# Patient Record
Sex: Male | Born: 1975
Health system: Southern US, Community
[De-identification: ages and names within clinical notes are randomized; demographics above are authoritative.]

## PROBLEM LIST (undated history)

## (undated) DIAGNOSIS — Q315 Congenital laryngomalacia: Secondary | ICD-10-CM

## (undated) DIAGNOSIS — D150 Benign neoplasm of thymus: Secondary | ICD-10-CM

## (undated) DIAGNOSIS — D2310 Other benign neoplasm of skin of unspecified eyelid, including canthus: Secondary | ICD-10-CM

## (undated) DIAGNOSIS — J40 Bronchitis, not specified as acute or chronic: Secondary | ICD-10-CM

## (undated) DIAGNOSIS — R0602 Shortness of breath: Secondary | ICD-10-CM

## (undated) HISTORY — PX: MINOR HEMORRHOIDECTOMY: SHX6238

## (undated) HISTORY — DX: Other benign neoplasm of skin of unspecified eyelid, including canthus: D23.10

## (undated) HISTORY — PX: OTHER SURGICAL HISTORY: SHX169

## (undated) HISTORY — DX: Congenital laryngomalacia: Q31.5

---

## 2012-02-21 ENCOUNTER — Ambulatory Visit (INDEPENDENT_AMBULATORY_CARE_PROVIDER_SITE_OTHER): Payer: PRIVATE HEALTH INSURANCE | Admitting: Family Medicine

## 2012-02-21 VITALS — BP 107/71 | HR 61

## 2012-02-21 DIAGNOSIS — M67919 Unspecified disorder of synovium and tendon, unspecified shoulder: Secondary | ICD-10-CM

## 2012-02-22 ENCOUNTER — Encounter: Payer: Self-pay | Admitting: Family Medicine

## 2012-02-22 DIAGNOSIS — M67919 Unspecified disorder of synovium and tendon, unspecified shoulder: Secondary | ICD-10-CM | POA: Insufficient documentation

## 2012-02-22 NOTE — Progress Notes (Signed)
  Subjective:    Patient ID: [name], male    DOB: 06/26/1976, 36 y.o.   MRN: [id number]  HPI Shoulder pain for several months. Bothers him most when he's trying to play tennis, doing certain exercises such as dumbbell progress, benchpress. Feels like a sharp pain in the anterior part of the shoulder with some resulting aching. It occasionally keeps him awake at night. Recalls  specific injury recently when he was performing jujitsu and was thrown down landing with axial load placed on his outstretched straight left arm. At that time he had some mild pain but was able to continue performance. Denies any numbness or tingling in his hand. He said no weakness in the arm. He is left-hand dominant.  PERTINENT  PMH / PSH: No prior history of left arm or shoulder injury. Negative for diabetes mellitus.   Review of Systems    denies unusual weight change, fever. Please see history present illness above for pertinent review of systems. Objective:   Physical Exam  Vital signs reviewed. GENERAL: Well developed, well nourished, no acute distress SHOULDER: Bilaterally symmetrical. Shoulder shrug is symmetrical and normal strength. Normal scapular motion. Mild to moderate pain with supraspinatus testing against resistance. He has full range of motion and intact strength in all planes of the rotator cuff. He also has some mild pain with resisted external rotation. Axial loading against the anterior shoulder capsule reproduces his pain. The biceps tendon is nontender.  ULTRASOUND:  Biceps tendon is well seated and there is no edema around it. There is some calcified area in the articular surface of the subscapularis muscle. There is no impingement. There are also some mild calcifications in one area of apparent scar but no muscular defect in the supraspinatus muscle.      Assessment & Plan:

## 2013-06-30 DIAGNOSIS — J40 Bronchitis, not specified as acute or chronic: Secondary | ICD-10-CM

## 2013-06-30 HISTORY — DX: Bronchitis, not specified as acute or chronic: J40

## 2013-07-02 ENCOUNTER — Emergency Department (HOSPITAL_COMMUNITY): Payer: PRIVATE HEALTH INSURANCE

## 2013-07-02 ENCOUNTER — Emergency Department (HOSPITAL_COMMUNITY)
Admission: EM | Admit: 2013-07-02 | Discharge: 2013-07-02 | Disposition: A | Discharge status: Home / Self Care | Payer: PRIVATE HEALTH INSURANCE | Attending: Emergency Medicine | Admitting: Emergency Medicine

## 2013-07-02 ENCOUNTER — Encounter (HOSPITAL_COMMUNITY): Payer: Self-pay | Admitting: Emergency Medicine

## 2013-07-02 DIAGNOSIS — IMO0001 Reserved for inherently not codable concepts without codable children: Secondary | ICD-10-CM | POA: Insufficient documentation

## 2013-07-02 DIAGNOSIS — R222 Localized swelling, mass and lump, trunk: Secondary | ICD-10-CM | POA: Insufficient documentation

## 2013-07-02 DIAGNOSIS — R509 Fever, unspecified: Secondary | ICD-10-CM | POA: Insufficient documentation

## 2013-07-02 DIAGNOSIS — Z87891 Personal history of nicotine dependence: Secondary | ICD-10-CM | POA: Insufficient documentation

## 2013-07-02 DIAGNOSIS — M542 Cervicalgia: Secondary | ICD-10-CM | POA: Insufficient documentation

## 2013-07-02 DIAGNOSIS — J9859 Other diseases of mediastinum, not elsewhere classified: Secondary | ICD-10-CM

## 2013-07-02 LAB — HCG, QUANTITATIVE, PREGNANCY: hCG, Beta Chain, Quant, S: <1 m[IU]/mL (ref <5)

## 2013-07-02 LAB — CBC WITH DIFFERENTIAL/PLATELET
Basophils Absolute: 0 10*3/uL (ref 0.0–0.1)
Basophils Relative: 0 % (ref 0–1)
Eosinophils Absolute: 0 10*3/uL (ref 0.0–0.7)
HCT: 40.1 % (ref 39.0–52.0)
Hemoglobin: 13.2 g/dL (ref 13.0–17.0)
Lymphocytes Relative: 33 % (ref 12–46)
Lymphs Abs: 3.2 10*3/uL (ref 0.7–4.0)
MCH: 26.1 pg (ref 26.0–34.0)
MCHC: 32.9 g/dL (ref 30.0–36.0)
Monocytes Absolute: 0.9 10*3/uL (ref 0.1–1.0)
Monocytes Relative: 9 % (ref 3–12)
Neutrophils Relative %: 58 % (ref 43–77)
RBC: 5.05 MIL/uL (ref 4.22–5.81)
RDW: 13.2 % (ref 11.5–15.5)

## 2013-07-02 LAB — COMPREHENSIVE METABOLIC PANEL
ALT: 36 U/L (ref 0–53)
Albumin: 4.3 g/dL (ref 3.5–5.2)
Alkaline Phosphatase: 77 U/L (ref 39–117)
BUN: 13 mg/dL (ref 6–23)
Chloride: 99 mEq/L (ref 96–112)
Creatinine, Ser: 0.82 mg/dL (ref 0.50–1.35)
GFR calc Af Amer: >90 mL/min (ref >90)
Glucose, Bld: 92 mg/dL (ref 70–99)
Potassium: 4.1 mEq/L (ref 3.5–5.1)
Sodium: 135 mEq/L (ref 135–145)
Total Bilirubin: 0.9 mg/dL (ref 0.3–1.2)
Total Protein: 7.7 g/dL (ref 6.0–8.3)

## 2013-07-02 MED ORDER — IOHEXOL 350 MG/ML SOLN
100.0000 mL | Freq: Once | INTRAVENOUS | Status: AC | PRN
  Administered 2013-07-02: 80 mL via INTRAVENOUS

## 2013-07-02 MED ORDER — SODIUM CHLORIDE 0.9 % IV SOLN
INTRAVENOUS | Status: DC
Start: 2013-07-02 — End: 2013-07-02

## 2013-07-02 NOTE — ED Provider Notes (Signed)
CSN: [id number]     Arrival date & time 07/02/13  1521 History   First MD Initiated Contact with Patient 07/02/13 1542     Chief Complaint  Patient presents with  . Neck Pain  . Pleurisy   (Consider location/radiation/quality/duration/timing/severity/associated sxs/prior Treatment) Patient is a 37 y.o. male presenting with neck pain. The history is provided by the patient.  Neck Pain  Pt complains of pleuritic chest pain, myalgias, and low grade fever x 2 days--using motrin with temporary relief--had some bilateral ant cervical pain that's since resolved, no headache or photophobia or rashes--temp at home 99.5--denies anginal type chest pain--pain described as sharp and worse with lying on his side-- no leg pain or swelling, but recent travel by car over 2 hours each way--currently, no ear pain or sore throat History reviewed. No pertinent past medical history. Past Surgical History  Procedure Laterality Date  . Tracheotomy      No family history on file. History  Substance Use Topics  . Smoking status: Former Games developer  . Smokeless tobacco: Not on file  . Alcohol Use: Yes     Comment: socially     Review of Systems  Musculoskeletal: Positive for neck pain.  All other systems reviewed and are negative.    Allergies  Review of patient's allergies indicates no known allergies.  Home Medications  No current outpatient prescriptions on file. BP 106/62  Pulse 52  Temp(Src) 97.5 F (36.4 C) (Oral)  Resp 16  SpO2 100% Physical Exam  Nursing note and vitals reviewed. Constitutional: He is oriented to person, place, and time. He appears well-developed and well-nourished.  Non-toxic appearance. No distress.  HENT:  Head: Normocephalic and atraumatic.  Eyes: Conjunctivae, EOM and lids are normal. Pupils are equal, round, and reactive to light.  Neck: Normal range of motion. Neck supple. No tracheal deviation present. No mass present.  Cardiovascular: Normal rate, regular rhythm  and normal heart sounds.  Exam reveals no gallop.   No murmur heard. Pulmonary/Chest: Effort normal and breath sounds normal. No stridor. No respiratory distress. He has no decreased breath sounds. He has no wheezes. He has no rhonchi. He has no rales.  Abdominal: Soft. Normal appearance and bowel sounds are normal. He exhibits no distension. There is no tenderness. There is no rebound and no CVA tenderness.  Musculoskeletal: Normal range of motion. He exhibits no edema and no tenderness.  Neurological: He is alert and oriented to person, place, and time. He has normal strength. No cranial nerve deficit or sensory deficit. GCS eye subscore is 4. GCS verbal subscore is 5. GCS motor subscore is 6.  Skin: Skin is warm and dry. No abrasion and no rash noted.  Psychiatric: He has a normal mood and affect. His speech is normal and behavior is normal.    ED Course  Procedures (including critical care time) Labs Review Labs Reviewed  D-DIMER, QUANTITATIVE   Imaging Review No results found.  EKG Interpretation     Ventricular Rate:  56 PR Interval:  177 QRS Duration: 85 QT Interval:  425 QTC Calculation: 410 R Axis:   85 Text Interpretation:  Sinus rhythm Probable left atrial enlargement RSR' in V1 or V2, probably normal variant            MDM  No diagnosis found.   Pt given results of ct scan and I spoke with dr. Darnelle Catalan who will see the patient in the morning-requested blood work drawn  Toy Baker, MD 07/02/13 1826

## 2013-07-02 NOTE — Progress Notes (Signed)
Patient confirms his pcp is Dr. [name].  System updated.

## 2013-07-02 NOTE — ED Notes (Signed)
Pt presents with c/o chest discomfort when he takes a deep breath and some neck pain that started on Saturday. Pt has had the flu shot but does believe that this may be the flu. Pt says he has had a small cough with sputum but no shortness of breath.

## 2013-07-03 ENCOUNTER — Other Ambulatory Visit: Payer: Self-pay | Admitting: *Deleted

## 2013-07-03 ENCOUNTER — Encounter: Payer: Self-pay | Admitting: Oncology

## 2013-07-03 ENCOUNTER — Institutional Professional Consult (permissible substitution) (INDEPENDENT_AMBULATORY_CARE_PROVIDER_SITE_OTHER): Payer: PRIVATE HEALTH INSURANCE | Admitting: Cardiothoracic Surgery

## 2013-07-03 ENCOUNTER — Other Ambulatory Visit: Payer: Self-pay | Admitting: Oncology

## 2013-07-03 ENCOUNTER — Ambulatory Visit (HOSPITAL_BASED_OUTPATIENT_CLINIC_OR_DEPARTMENT_OTHER): Payer: PRIVATE HEALTH INSURANCE | Admitting: Lab

## 2013-07-03 ENCOUNTER — Encounter: Payer: Self-pay | Admitting: Cardiothoracic Surgery

## 2013-07-03 ENCOUNTER — Ambulatory Visit (HOSPITAL_BASED_OUTPATIENT_CLINIC_OR_DEPARTMENT_OTHER): Payer: PRIVATE HEALTH INSURANCE | Admitting: Oncology

## 2013-07-03 ENCOUNTER — Ambulatory Visit (HOSPITAL_COMMUNITY)
Admission: RE | Admit: 2013-07-03 | Discharge: 2013-07-03 | Disposition: A | Discharge status: Home / Self Care | Payer: PRIVATE HEALTH INSURANCE | Source: Ambulatory Visit | Attending: Oncology | Admitting: Oncology

## 2013-07-03 VITALS — BP 110/71 | HR 55 | Temp 98.8°F | Resp 20 | Wt 159.2 lb

## 2013-07-03 VITALS — BP 116/69 | HR 66 | Resp 16 | Ht 66.0 in | Wt 160.0 lb

## 2013-07-03 DIAGNOSIS — R0602 Shortness of breath: Secondary | ICD-10-CM

## 2013-07-03 DIAGNOSIS — R222 Localized swelling, mass and lump, trunk: Secondary | ICD-10-CM | POA: Insufficient documentation

## 2013-07-03 DIAGNOSIS — J9859 Other diseases of mediastinum, not elsewhere classified: Secondary | ICD-10-CM

## 2013-07-03 LAB — AFP TUMOR MARKER: AFP-Tumor Marker: <1.3 ng/mL (ref 0.0–8.0)

## 2013-07-03 LAB — LACTATE DEHYDROGENASE (CC13): LDH: 265 U/L — ABNORMAL HIGH (ref 125–245)

## 2013-07-03 MED ORDER — IOHEXOL 300 MG/ML  SOLN
100.0000 mL | Freq: Once | INTRAMUSCULAR | Status: AC | PRN
  Administered 2013-07-03: 100 mL via INTRAVENOUS

## 2013-07-03 NOTE — Progress Notes (Signed)
Checked in new patient with no financial issues. He has not been to [place]. Tiffany escorted him to Dr. Darnelle Catalan.

## 2013-07-03 NOTE — Progress Notes (Signed)
[address]       [place] [address]             [phone]                    [facility] Medical Record #[id number] Date of Birth: 11-11-1975  Referring: Magrinat, Valentino Hue, MD Primary Care: [name], MD  Chief Complaint:    . 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 He came for medical attention when he noted increasing shortness of breath when climbing stairs over the past several days. Chest x-ray was normal d-dimer was mildly elevated. For this reason a CT scan of the chest was done to rule out pulmonary emboli. 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     Current Activity/ Functional Status:  Patient is independent with mobility/ambulation, transfers, ADL's, IADL's.  Zubrod Score: At the time of surgery this patient's most appropriate activity status/level should be described as: []  Normal activity, no symptoms [x]  Symptoms, fully ambulatory []  Symptoms, in bed less than or equal to 50% of the time []  Symptoms, in bed greater than 50% of the time but less than 100% []  Bedridden []  Moribund   Past Medical History  Diagnosis Date  . Laryngomalacia, congenital     resolved  . Dermoid cyst of eyelid     removed from left upper canthus area    Past Surgical History  Procedure Laterality Date  . Tracheotomy     . Minor hemorrhoidectomy        History   Social History  . Marital Status: Married    Spouse Name: Ernest Haber    Number of Children: 29 son  38 years old  . Years of Education: MD   Occupational History  . 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 Smoking status: Former  Smoker -- 0.25 packs/day for 3 years    Types: Cigarettes    Quit date: 07/03/2013  . Smokeless tobacco: Never Used  . Alcohol Use: Yes     Comment: socially   . Drug Use: No      History  Smoking status  . Former Smoker -- 0.25 packs/day for 3 years  . Types: Cigarettes  . Quit date: 07/03/2013  Smokeless tobacco  . Never Used    History  Alcohol Use  . Yes    Comment: socially      No Known Allergies  No current outpatient prescriptions on file.   No current facility-administered medications for this visit.    Family History: Patient's mother is alive has had a history of hip replacement and depression she is a physician in [place], father had CAD in 65 at age 55, paternal grandfather had history of prostate cancer maternal grandmother renal failure and diabetes paternal grandmother died of abdominal mass. Patient has one brother who is healthy with the exception of smoking. One 64-year-old child who is healthy   Review of Systems:     Cardiac Review of Systems: Y or N  Chest Pain [  y  ]  Resting SOB [  n ]  Exertional SOB  [ y ]  Myer Peer  ]   Pedal Edema [ n  ]    Palpitations [ n] Syncope  [ n ]   Presyncope [n  ]  General Review of Systems: [Y] = yes [  ]=no Constitional: recent weight change [ n ]; anorexia [n  ]; fatigue Cove.Etienne  ]; nausea [  n]; night sweats [ n ]; fever [ n ]; or chills [ y ];                                                                                                                                          Dental: poor dentition[n  ]; Last Dentist visit:   Eye : blurred vision [n  ]; diplopia [ n ]; vision changes [ n];  Amaurosis fugax[  n]; Resp: cough [  ];  wheezing[ n ];  hemoptysis[n  ]; shortness of breath[y  ]; paroxysmal nocturnal dyspnea[  ]; dyspnea on exertion[ y ]; or orthopnea[  ];  GI:  gallstones[  ], vomiting[  ];  dysphagia[  ]; melena[  ];  hematochezia [  ]; heartburn[  ];   Hx of  Colonoscopy[  ]; GU: kidney stones  [  ]; hematuria[  ];   dysuria [  ];  nocturia[  ];  history of     obstruction [  ]; urinary frequency [  ]             Skin: rash, swelling[  ];, hair loss[  ];  peripheral edema[  ];  or itching[  ]; Musculosketetal: myalgias[  ];  joint swelling[  ];  joint erythema[  ];  joint pain[  ];  back pain[  ];  Heme/Lymph: bruising[  ];  bleeding[  ];  anemia[  ];  Neuro: TIA[  ];  headaches[  ];  stroke[  ];  vertigo[  ];  seizures[  ];   paresthesias[  ];  difficulty walking[n  ];  Psych:depression[  ]; anxiety[  ];  Endocrine: diabetes[  ];  thyroid dysfunction[  ];  Immunizations: Flu Cove.Etienne  ]; Pneumococcal[n  ];  Other:  Physical Exam: BP 116/69  Pulse 66  Resp 16  Ht 5\' 6"  (1.676 m)  Wt 160 lb (72.576 kg)  BMI 25.84 kg/m2  SpO2 99%  General appearance: alert, cooperative and appears stated age Neurologic: intact Heart: regular rate and rhythm, S1, S2 normal, no murmur, click, rub or gallop Lungs: clear to auscultation bilaterally and normal percussion bilaterally Abdomen: soft, non-tender; bowel sounds normal; no masses,  no organomegaly Extremities: extremities normal, atraumatic, no cyanosis or edema and Homans sign is negative, no sign of DVT Normal male genitalia without evidence of testicular mass no inguinal adenopathy or hernias Scarring in the suprasternal notch from previous tracheal surgery No cervical or supraclavicular adenopathy  Diagnostic Studies & Laboratory data:  Recent Radiology Findings:   Dg Chest 2 View  07/02/2013   CLINICAL DATA:  Chest pain with respiration. Some neck pain.  EXAM: CHEST  2 VIEW  COMPARISON:  None.  FINDINGS: Lungs are adequately inflated without consolidation or effusion. Cardiomediastinal silhouette is within normal. There is minimal spondylosis of the spine.  IMPRESSION: No acute cardiopulmonary disease.   Electronically Signed   By: Elberta Fortis M.D.   On: 07/02/2013 16:30   Ct Angio Chest Pe W/cm &/or Wo Cm  07/02/2013   CLINICAL  DATA:  Initial encounter for chest pain and shortness of breath. Elevated D-dimer.  EXAM: CT ANGIOGRAPHY CHEST WITH CONTRAST  TECHNIQUE: Multidetector CT imaging of the chest was performed using the standard protocol during bolus administration of intravenous contrast. Multiplanar CT image reconstructions including MIPs were obtained to evaluate the vascular anatomy.  CONTRAST:  80mL OMNIPAQUE IOHEXOL 350 MG/ML IV.  COMPARISON:  None.  FINDINGS: Contrast opacification of the pulmonary arteries is very good. No filling defects within either main pulmonary artery or their branches in either lung to suggest pulmonary embolism. Heart size normal. No pericardial effusion. No visible coronary atherosclerosis. Mild calcified plaque in the proximal descending thoracic aorta, without evidence of atherosclerosis elsewhere.  Mass in the anterior superior mediastinum measuring approximately 3.7 x 5.9 x 4.8 cm (series 5, image 37 and series 9, image 49). No mass or lymphadenopathy elsewhere in the mediastinum, either hilum, or either axilla. Visualized thyroid gland unremarkable.  Pulmonary parenchyma clear without evidence of localized airspace consolidation, interstitial disease, or parenchymal nodules or masses. No pleural effusions. Central airways patent with mild bronchial wall thickening.  Moderate bilateral gynecomastia. Possible hepatosplenomegaly, though these organs are incompletely imaged. Calcified granuloma in the anterior segment right lobe of liver. Visualized upper abdomen otherwise unremarkable. Bone window images demonstrate moderate thoracic spondylosis.  Review of the MIP images confirms the above findings.  IMPRESSION: 1. No evidence of pulmonary embolism. 2. Anterior superior mediastinal mass, measured above, without evidence of mass or lymphadenopathy elsewhere. Differential diagnosis would include thymoma, lymphoma, and teratoma. 3. Mild central bronchial wall thickening is consistent with asthma and/or  bronchitis. No acute cardiopulmonary disease otherwise. 4. Moderate bilateral gynecomastia. 5. Possible hepatosplenomegaly, though these organs were not completely imaged on the chest CT. 6. Minimal calcified plaque in the proximal descending thoracic aorta, indicating advanced atherosclerosis for age.   Electronically Signed   By: Hulan Saas M.D.   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  EXAM: CT ABDOMEN AND PELVIS WITH CONTRAST  TECHNIQUE: Multidetector CT imaging of the abdomen and pelvis was performed using the standard protocol following bolus administration of intravenous contrast.  CONTRAST:  OMNIPAQUE IOHEXOL 300 MG/ML  SOLN  COMPARISON:  Chest CT 07/02/2013.  FINDINGS: The solid abdominal organs are normal. The No mass lesions or evidence of metastatic disease. The gallbladder is normal. No common bowel duct dilatation.  The stomach, duodenum, small bowel and colon are unremarkable. No inflammatory changes, mass lesions or obstructive findings. The appendix is normal. No mesenteric or retroperitoneal mass or adenopathy. The aorta and branch vessels are normal. The portal, hepatic, splenic and renal veins are patent.  The bladder, prostate gland and seminal vesicles are unremarkable. No pelvic mass, adenopathy or free pelvic fluid collections. No inguinal mass or adenopathy.  The bony structures are unremarkable. Bilateral pars defects are noted at L5 without spondylolisthesis. No bone  lesions. There are degenerative changes noted in the lower thoracic spine.  IMPRESSION: Unremarkable CT abdomen/ pelvis. No findings for metastatic disease.   Electronically Signed   By: Loralie Champagne M.D.   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  I reviewed the radiographic findings in detail with the patient and his wife. The anterior mediastinal mass appears separate from the thyroid, is large but is not obviously invading surrounding structures by CT. I recommended to the patient that we proceed with sternotomy or partial sternotomy and midline resection of the mass both for treatment and diagnostic reasons. The procedures described in detail and the patient is willing to proceed. Like to talk to his family but would be willing to proceed with surgery on Friday, November 7.    [name] MD      [address] [place] [place] [address] Office [phone]   Beeper [phone]  07/03/2013 3:16 PM

## 2013-07-03 NOTE — Progress Notes (Signed)
ID: [name] OB: June 08, 1976  MR#: [id number]  WUJ#:[id number]  PCP: [name], MD GYN:   SU:  OTHER MD:  CHIEF COMPLAINT: "I'm having trouble breathing."  HISTORY OF PRESENT ILLNESS: [name] started feeling a little tired and short of breath about 3 days ago (end of October 20 14th). On November 1 she had myalgias and arthralgias and a temperature up to 99.0. He was feeling like a neck pain bilaterally, TMJ-like symptoms, but the main problem was that he couldn't take a deep breath. There was no pleurisy. There was no cough, phlegm production, or hemoptysis.   As the symptoms did not resolve he presented to the emergency room where a d-dimer was obtained. As this was elevated, a CT angiogram followed. 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 This was clearly separate from the thyroid gland. There was no obvious adenopathy.   With this information we were contacted. We suggested the obtaining of additional lab work, to include an alpha-fetoprotein and beta hCG. [name]'s subsequent history is as detailed below.  INTERVAL HISTORY: [name] was evaluated at the [facility] 07/03/2013 accompanied by his wife [name].  REVIEW OF SYSTEMS: Aside from the mild temperature noted 2 days ago, J. has had no fever, drenching sweats, unexplained severe fatigue (he has continued to work full-time), or unexplained weight loss. He is not aware of any adenopathy. He has a history of eyelids drooping, left greater than right, which has been present many years, and is not more prominent now than before. He has a history of gynecomastia going back to his early teens. Otherwise, though he is very active physically (goes to the gym regularly ) and has not missed any work, to chief symptom is a sensation that he cannot take a deep breath. Carrying his son on his shoulders a few steps upstairs I made him more short of breath than usual. A detailed review of  systems was otherwise entirely non-contributory.  PAST MEDICAL HISTORY: Past Medical History  Diagnosis Date  . Laryngomalacia, congenital     resolved  . Dermoid cyst of eyelid     removed from left upper canthus area    PAST SURGICAL HISTORY: Past Surgical History  Procedure Laterality Date  . Tracheotomy     . Minor hemorrhoidectomy      FAMILY HISTORY No family history on file. The patient's parents are living, in their mid 18s. The patient has one brother, no sisters. There is no history of cancer in the immediate family. The patient's father's father was diagnosed with prostate cancer in his 32s. The patient's father's mother was diagnosed with some intra-abdominal malignancy.  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 His wife [name] is a Adult nurse at [employer]. They have one son, [name], currently 18 years old. They tell me they do not intend to have any more children. The patient's cultur/re yearal sometime ligious background is Hindu.   ADVANCED DIRECTIVES: In place   HEALTH MAINTENANCE: History  Substance Use Topics  . Smoking status: Former Games developer  . Smokeless tobacco: Not on file  . Alcohol Use: Yes     Comment: socially      Colonoscopy:  PSA: n/a:  Bone density:  Lipid panel:  No Known Allergies  No current outpatient prescriptions on file.   No current facility-administered medications for this visit.    OBJECTIVE: There were no vitals filed for this visit.   There  is no height or weight on file to calculate BMI.    ECOG FS:1 - Symptomatic but completely ambulatory  Ocular: Sclerae unicteric, pupils equal, round; no obvious ptosis Lymphatic: No cervical or supraclavicular adenopathy; minimal right axillary adenopathy Lungs no rales or rhonchi, good excursion bilaterally Heart regular rate and rhythm, no murmur appreciated Abd soft, nontender, positive bowel sounds, no splenomegaly  MSK no focal spinal tenderness, no joint  edema Neuro: non-focal, well-oriented, appropriate affect Breasts:  minimal gynecomastia, no masses palpated    LAB RESULTS: Results for GARDINER, ESPANA (MRN [id number]) as of 07/03/2013 08:54  Ref. Range 07/02/2013 18:25  AFP-Tumor Marker Latest Range: 0.0-8.0 ng/mL <1.3  Results for REGINALD, WEIDA [facility] (MRN [id number]) as of 07/03/2013 08:54  Ref. Range 07/02/2013 18:25  hCG, Beta Chain, Quant, S Latest Range: <5 mIU/mL <1    CMP     Component Value Date/Time   NA 135 07/02/2013 1825   K 4.1 07/02/2013 1825   CL 99 07/02/2013 1825   CO2 25 07/02/2013 1825   GLUCOSE 92 07/02/2013 1825   BUN 13 07/02/2013 1825   CREATININE 0.82 07/02/2013 1825   CALCIUM 9.8 07/02/2013 1825   PROT 7.7 07/02/2013 1825   ALBUMIN 4.3 07/02/2013 1825   AST 54* 07/02/2013 1825   ALT 36 07/02/2013 1825   ALKPHOS 77 07/02/2013 1825   BILITOT 0.9 07/02/2013 1825   GFRNONAA >90 07/02/2013 1825   GFRAA >90 07/02/2013 1825    I No results found for this basename: SPEP,  UPEP,   kappa and lambda light chains    Lab Results  Component Value Date   WBC 9.8 07/02/2013   NEUTROABS 5.6 07/02/2013   HGB 13.2 07/02/2013   HCT 40.1 07/02/2013   MCV 79.4 07/02/2013   PLT 250 07/02/2013      Chemistry      Component Value Date/Time   NA 135 07/02/2013 1825   K 4.1 07/02/2013 1825   CL 99 07/02/2013 1825   CO2 25 07/02/2013 1825   BUN 13 07/02/2013 1825   CREATININE 0.82 07/02/2013 1825      Component Value Date/Time   CALCIUM 9.8 07/02/2013 1825   ALKPHOS 77 07/02/2013 1825   AST 54* 07/02/2013 1825   ALT 36 07/02/2013 1825   BILITOT 0.9 07/02/2013 1825       No results found for this basename: LABCA2    No components found with this basename: LABCA125    No results found for this basename: INR,  in the last 168 hours  Urinalysis No results found for this basename: colorurine,  appearanceur,  labspec,  phurine,  glucoseu,  hgbur,  bilirubinur,  ketonesur,  proteinur,  urobilinogen,  nitrite,  leukocytesur     STUDIES: Dg Chest 2 View  07/02/2013   CLINICAL DATA:  Chest pain with respiration. Some neck pain.  EXAM: CHEST  2 VIEW  COMPARISON:  None.  FINDINGS: Lungs are adequately inflated without consolidation or effusion. Cardiomediastinal silhouette is within normal. There is minimal spondylosis of the spine.  IMPRESSION: No acute cardiopulmonary disease.   Electronically Signed   By: Elberta Fortis M.D.   On: 07/02/2013 16:30   Ct Angio Chest Pe W/cm &/or Wo Cm  07/02/2013   CLINICAL DATA:  Initial encounter for chest pain and shortness of breath. Elevated D-dimer.  EXAM: CT ANGIOGRAPHY CHEST WITH CONTRAST  TECHNIQUE: Multidetector CT imaging of the chest was performed using the standard protocol during bolus administration of intravenous contrast. Multiplanar  CT image reconstructions including MIPs were obtained to evaluate the vascular anatomy.  CONTRAST:  80mL OMNIPAQUE IOHEXOL 350 MG/ML IV.  COMPARISON:  None.  FINDINGS: Contrast opacification of the pulmonary arteries is very good. No filling defects within either main pulmonary artery or their branches in either lung to suggest pulmonary embolism. Heart size normal. No pericardial effusion. No visible coronary atherosclerosis. Mild calcified plaque in the proximal descending thoracic aorta, without evidence of atherosclerosis elsewhere.  Mass in the anterior superior mediastinum measuring approximately 3.7 x 5.9 x 4.8 cm (series 5, image 37 and series 9, image 49). No mass or lymphadenopathy elsewhere in the mediastinum, either hilum, or either axilla. Visualized thyroid gland unremarkable.  Pulmonary parenchyma clear without evidence of localized airspace consolidation, interstitial disease, or parenchymal nodules or masses. No pleural effusions. Central airways patent with mild bronchial wall thickening.  Moderate bilateral gynecomastia. Possible hepatosplenomegaly, though these organs are incompletely imaged. Calcified granuloma in the anterior  segment right lobe of liver. Visualized upper abdomen otherwise unremarkable. Bone window images demonstrate moderate thoracic spondylosis.  Review of the MIP images confirms the above findings.  IMPRESSION: 1. No evidence of pulmonary embolism. 2. Anterior superior mediastinal mass, measured above, without evidence of mass or lymphadenopathy elsewhere. Differential diagnosis would include thymoma, lymphoma, and teratoma. 3. Mild central bronchial wall thickening is consistent with asthma and/or bronchitis. No acute cardiopulmonary disease otherwise. 4. Moderate bilateral gynecomastia. 5. Possible hepatosplenomegaly, though these organs were not completely imaged on the chest CT. 6. Minimal calcified plaque in the proximal descending thoracic aorta, indicating advanced atherosclerosis for age.   Electronically Signed   By: Hulan Saas M.D.   On: 07/02/2013 17:53    ASSESSMENT: 37 y.o.  [place] man presenting with shortness of breath, CT scan showing an anterior mediastinal mass, with normal beta hCG and alpha-fetoprotein  PLAN: We discussed Jager's situation in detail. He understands that in a young man an anterior mediastinal mass can represent thymoma, thymic carcinoma, a germ cell tumor including seminoma, nonseminomatous tumors, and teratoma, or lymphoma, which could be T. or B-cell. Tuberculosis would be much less common in the setting. The thyroid gland is seen clearly separate from this mass and I do not think it is in the differential.  Since the alpha-fetoprotein and beta hCG are normal, we are going to have to proceed to biopsy. We are going to obtain a CT scan of the retroperitoneum just to make sure there is nothing else that might be easier to biopsy, and if there is not we will try to obtain a core biopsy today. The patient is planning to go to [place] to visit family in the next few days. He will be back November 10. Possibly we would have the diagnosis at that time and could proceed to  discussion of prognosis and treatment.   [place] and [place] have a very good understanding of this plan. They understand also the goal of treatment is cure. I am making them a return appointment here for November 10 to discuss results of the biopsy we hope to obtain today.  Lowella Dell, MD   07/03/2013 8:49 AM  ADDENDUM: CT of abdomen/ pelvis is unremarkable. Have discussed situation with Dr gerhardt and Dr Mahala Menghini and the plan will be for surgery, likely 07/04/2013. I will meet with the patient as soon as we have the final pathology to discuss definitive plans.

## 2013-07-04 ENCOUNTER — Encounter (HOSPITAL_COMMUNITY): Payer: Self-pay | Admitting: Pharmacy Technician

## 2013-07-04 ENCOUNTER — Other Ambulatory Visit: Payer: Self-pay | Admitting: *Deleted

## 2013-07-04 DIAGNOSIS — J9859 Other diseases of mediastinum, not elsewhere classified: Secondary | ICD-10-CM

## 2013-07-05 ENCOUNTER — Encounter (HOSPITAL_COMMUNITY)
Admission: RE | Admit: 2013-07-05 | Discharge: 2013-07-05 | Disposition: A | Discharge status: Home / Self Care | Payer: PRIVATE HEALTH INSURANCE | Source: Ambulatory Visit | Attending: Cardiothoracic Surgery | Admitting: Cardiothoracic Surgery

## 2013-07-05 ENCOUNTER — Other Ambulatory Visit: Payer: Self-pay | Admitting: Oncology

## 2013-07-05 ENCOUNTER — Encounter (HOSPITAL_COMMUNITY): Payer: Self-pay

## 2013-07-05 VITALS — BP 107/67 | HR 60 | Temp 98.3°F | Resp 18 | Ht 66.0 in | Wt 159.6 lb

## 2013-07-05 DIAGNOSIS — J9859 Other diseases of mediastinum, not elsewhere classified: Secondary | ICD-10-CM

## 2013-07-05 HISTORY — DX: Shortness of breath: R06.02

## 2013-07-05 HISTORY — DX: Bronchitis, not specified as acute or chronic: J40

## 2013-07-05 LAB — TYPE AND SCREEN
ABO/RH(D): O POS
Antibody Screen: NEGATIVE

## 2013-07-05 LAB — URINALYSIS, ROUTINE W REFLEX MICROSCOPIC
Bilirubin Urine: NEGATIVE
Glucose, UA: NEGATIVE mg/dL
Hgb urine dipstick: NEGATIVE
Ketones, ur: NEGATIVE mg/dL
Leukocytes, UA: NEGATIVE
Nitrite: NEGATIVE
Protein, ur: NEGATIVE mg/dL
Specific Gravity, Urine: 1.02 (ref 1.005–1.030)
Urobilinogen, UA: 0.2 mg/dL (ref 0.0–1.0)
pH: 6.5 (ref 5.0–8.0)

## 2013-07-05 LAB — BLOOD GAS, ARTERIAL
Acid-Base Excess: 0.9 mmol/L (ref 0.0–2.0)
Bicarbonate: 24.8 mEq/L — ABNORMAL HIGH (ref 20.0–24.0)
FIO2: 0.21 %
O2 Saturation: 95.6 %
Patient temperature: 98.6
TCO2: 26 mmol/L (ref 0–100)
pCO2 arterial: 38 mmHg (ref 35.0–45.0)
pH, Arterial: 7.43 (ref 7.350–7.450)
pO2, Arterial: 74.9 mmHg — ABNORMAL LOW (ref 80.0–100.0)

## 2013-07-05 LAB — SURGICAL PCR SCREEN
MRSA, PCR: NEGATIVE
Staphylococcus aureus: POSITIVE — AB

## 2013-07-05 LAB — ABO/RH: ABO/RH(D): O POS

## 2013-07-05 LAB — PROTIME-INR
INR: 0.91 (ref 0.00–1.49)
Prothrombin Time: 12.1 seconds (ref 11.6–15.2)

## 2013-07-05 LAB — APTT: aPTT: 33 seconds (ref 24–37)

## 2013-07-05 MED ORDER — DEXTROSE 5 % IV SOLN
1.5000 g | INTRAVENOUS | Status: AC
  Administered 2013-07-06: 1.5 g via INTRAVENOUS
  Filled 2013-07-05: qty 1.5

## 2013-07-05 NOTE — Anesthesia Preprocedure Evaluation (Addendum)
Anesthesia Evaluation  Patient identified by MRN, date of birth, ID band Patient awake    Reviewed: Allergy & Precautions, H&P , NPO status , Patient's Chart, lab work & pertinent test results  Airway Mallampati: I TM Distance: >3 FB Neck ROM: Full    Dental  (+) Teeth Intact and Dental Advisory Given   Pulmonary shortness of breath,  07-05-13 FINDINGS: There is an anterior mediastinal mass. There is no pneumomediastinum or pneumothorax. Lungs are clear. Heart size and pulmonary vascularity are normal. No adenopathy outside of the anterior mediastinal region. No bone lesions.   IMPRESSION: Anterior mediastinal mass, not appreciably changed from recent prior studies. The lungs are clear. No pneumomediastinum or pneumothorax.     Pulmonary exam normal       Cardiovascular Exercise Tolerance: Good Rhythm:Regular Rate:Normal     Neuro/Psych    GI/Hepatic negative GI ROS, Neg liver ROS,   Endo/Other  negative endocrine ROS  Renal/GU negative Renal ROS     Musculoskeletal negative musculoskeletal ROS (+) Arthritis -, Osteoarthritis,    Abdominal Normal abdominal exam  (+)   Peds  Hematology negative hematology ROS (+)   Anesthesia Other Findings   Reproductive/Obstetrics                        Anesthesia Physical Anesthesia Plan  ASA: III  Anesthesia Plan: General   Post-op Pain Management:    Induction: Intravenous  Airway Management Planned: Oral ETT  Additional Equipment: Arterial line  Intra-op Plan:   Post-operative Plan: Extubation in OR  Informed Consent: I have reviewed the patients History and Physical, chart, labs and discussed the procedure including the risks, benefits and alternatives for the proposed anesthesia with the patient or authorized representative who has indicated his/her understanding and acceptance.   Dental advisory given  Plan Discussed with: CRNA and  Surgeon  Anesthesia Plan Comments: (See my anesthesia note regarding patient concerns about airway (tube size).  Acetylcholine receptor binding and Myasthenia gravis panel are still pending, and results are not anticipated to be available prior to his surgery.  Shonna Chock, PA-C)      Anesthesia Quick Evaluation

## 2013-07-05 NOTE — Pre-Procedure Instructions (Signed)
[name]  07/05/2013   Your procedure is scheduled on:  Friday, Nov. 7  Report to [facility] Main Entrance "A" at 7:30 AM.  Call this number if you have problems the morning of surgery: [phone]   Remember:   Do not eat food or drink liquids after midnight.   Take these medicines the morning of surgery with A SIP OF WATER:  none   Do not wear jewelry, make-up or nail polish.  Do not wear lotions, powders, or perfumes. You may wear deodorant.  Do not shave 48 hours prior to surgery. Men may shave face and neck.  Do not bring valuables to the hospital.  [facility] is not responsible for any belongings or valuables.               Contacts, dentures or bridgework may not be worn into surgery.  Leave suitcase in the car. After surgery it may be brought to your room.  For patients admitted to the hospital, discharge time is determined by your treatment team.               Patients discharged the day of surgery will not be allowed to drive  home.  Name and phone number of your driver:   Special Instructions: Shower using CHG 2 nights before surgery and the night before surgery.  If you shower the day of surgery use CHG.  Use special wash - you have one bottle of CHG for all showers.  You should use approximately 1/3 of the bottle for each shower.   Please read over the following fact sheets that you were given: Pain Booklet, Coughing and Deep Breathing, Blood Transfusion Information, MRSA Information and Surgical Site Infection Prevention

## 2013-07-05 NOTE — Progress Notes (Signed)
Anesthesia PAT Evaluation:  Patient is a 37 year old male scheduled for video bronchoscopy, resection of anterior mediastinal mass, possible partial sternotomy on 07/06/13 by Dr. Tyrone Sage. According to [facility] notes from Dr. Darnelle Catalan, differential could include thymoma, thymic carcinoma, a germ cell tumor including seminoma, nonseminomatous tumors, and teratoma, or lymphoma, which could be T. or B-cell. Tuberculosis would be much less common in the setting. The thyroid gland was felt separate from the mass so it was not included in the differential.  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  History of congenital laryngomalacia wit history of tracheotomy, brief history of smoking in 1995, dermoid cyst of the eyelid, SOB, minor hemorrhoidectomy.  Anesthesia concerns:  He has some concerns about potential need for larger ETT with bronchoscopy due to his history of laryngomalacia and tracheostomy from age 39 months until age 86-7 months.  He does not recall having general anesthesia/intubation since then.  He though it may appear narrowed on prior CT.  He has good voice quality without hoarseness.  He also wanted to make sure that the anesthesiologist knew that he recently had a myasthenia gravis panel and acetylcholine receptor binding drawn on 07/03/13 at the [facility] (results still pending) since that could influence agents used.  (Labs said the acetylcholine test typically takes 6-10 days.)   EKG on 07/02/13 showed NSR, possible LAE, RSR prime in V1 or V2.  CXR on 07/05/13 showed: Anterior mediastinal mass, not appreciably changed from recent prior studies. The lungs are clear. No pneumomediastinum or pneumothorax.  Labs from 07/02/13 and 07/05/13 noted.  I did discuss his anesthesia concerns with anesthesiologist Dr. Michelle Piper and spoke with patient about possible ways this could be managed.  I also sent Dr. Tyrone Sage and his nurse Alycia Rossetti a staff message regarding patient's concerns.  He understands that he will  be evaluated by his assigned anesthesiologist and Dr. Tyrone Sage tomorrow morning and the definitive anesthesia plan will be determined at that time.  Velna [facility] Short Stay Center/Anesthesiology Phone [phone] 07/05/2013 3:24 PM

## 2013-07-06 ENCOUNTER — Inpatient Hospital Stay (HOSPITAL_COMMUNITY)
Admission: RE | Admit: 2013-07-06 | Discharge: 2013-07-08 | DRG: 983 | Disposition: A | Discharge status: Home / Self Care | Payer: PRIVATE HEALTH INSURANCE | Source: Ambulatory Visit | Attending: Cardiothoracic Surgery | Admitting: Cardiothoracic Surgery

## 2013-07-06 ENCOUNTER — Encounter (HOSPITAL_COMMUNITY)
Admission: RE | Disposition: A | Discharge status: Home / Self Care | Payer: Self-pay | Source: Ambulatory Visit | Attending: Cardiothoracic Surgery

## 2013-07-06 ENCOUNTER — Encounter (HOSPITAL_COMMUNITY): Payer: Self-pay | Admitting: Anesthesiology

## 2013-07-06 ENCOUNTER — Inpatient Hospital Stay (HOSPITAL_COMMUNITY): Payer: PRIVATE HEALTH INSURANCE

## 2013-07-06 ENCOUNTER — Encounter (HOSPITAL_COMMUNITY): Payer: PRIVATE HEALTH INSURANCE | Admitting: Vascular Surgery

## 2013-07-06 ENCOUNTER — Ambulatory Visit (HOSPITAL_COMMUNITY): Payer: PRIVATE HEALTH INSURANCE | Admitting: Anesthesiology

## 2013-07-06 DIAGNOSIS — R222 Localized swelling, mass and lump, trunk: Secondary | ICD-10-CM

## 2013-07-06 DIAGNOSIS — D4989 Neoplasm of unspecified behavior of other specified sites: Secondary | ICD-10-CM

## 2013-07-06 DIAGNOSIS — Z87891 Personal history of nicotine dependence: Secondary | ICD-10-CM

## 2013-07-06 DIAGNOSIS — D150 Benign neoplasm of thymus: Principal | ICD-10-CM | POA: Diagnosis present

## 2013-07-06 DIAGNOSIS — J9859 Other diseases of mediastinum, not elsewhere classified: Secondary | ICD-10-CM

## 2013-07-06 HISTORY — PX: MEDIASTERNOTOMY: SHX5084

## 2013-07-06 HISTORY — DX: Neoplasm of unspecified behavior of other specified sites: D49.89

## 2013-07-06 HISTORY — PX: VIDEO BRONCHOSCOPY: SHX5072

## 2013-07-06 SURGERY — BRONCHOSCOPY, VIDEO-ASSISTED
Anesthesia: General | Wound class: Clean Contaminated

## 2013-07-06 MED ORDER — FENTANYL 10 MCG/ML IV SOLN
INTRAVENOUS | Status: DC
  Administered 2013-07-06: 19:00:00 via INTRAVENOUS
  Filled 2013-07-06 (×3): qty 50

## 2013-07-06 MED ORDER — SENNOSIDES-DOCUSATE SODIUM 8.6-50 MG PO TABS
1.0000 | ORAL_TABLET | Freq: Every evening | ORAL | Status: DC | PRN
  Filled 2013-07-06: qty 1

## 2013-07-06 MED ORDER — MUPIROCIN 2 % EX OINT
1.0000 "application " | TOPICAL_OINTMENT | Freq: Two times a day (BID) | CUTANEOUS | Status: DC
  Administered 2013-07-06 – 2013-07-08 (×4): 1 via NASAL

## 2013-07-06 MED ORDER — MIDAZOLAM HCL 5 MG/5ML IJ SOLN
INTRAMUSCULAR | Status: DC | PRN
  Administered 2013-07-06: 2 mg via INTRAVENOUS

## 2013-07-06 MED ORDER — BISACODYL 5 MG PO TBEC: 10.0000 mg | DELAYED_RELEASE_TABLET | Freq: Every day | ORAL | Status: DC

## 2013-07-06 MED ORDER — DIPHENHYDRAMINE HCL 12.5 MG/5ML PO ELIX
12.5000 mg | ORAL_SOLUTION | Freq: Four times a day (QID) | ORAL | Status: DC | PRN
  Filled 2013-07-06: qty 5

## 2013-07-06 MED ORDER — MUPIROCIN 2 % EX OINT
TOPICAL_OINTMENT | Freq: Two times a day (BID) | CUTANEOUS | Status: DC
  Administered 2013-07-06: 1 via NASAL
  Filled 2013-07-06 (×2): qty 22

## 2013-07-06 MED ORDER — NEOSTIGMINE METHYLSULFATE 1 MG/ML IJ SOLN
INTRAMUSCULAR | Status: DC | PRN
  Administered 2013-07-06: 4 mg via INTRAVENOUS

## 2013-07-06 MED ORDER — SODIUM CHLORIDE 0.9 % IJ SOLN
OROMUCOSAL | Status: DC | PRN
  Administered 2013-07-06: 13:00:00 via TOPICAL

## 2013-07-06 MED ORDER — SODIUM CHLORIDE 0.9 % IJ SOLN
3.0000 mL | Freq: Two times a day (BID) | INTRAMUSCULAR | Status: DC
  Administered 2013-07-06 – 2013-07-07 (×3): 3 mL via INTRAVENOUS

## 2013-07-06 MED ORDER — OXYCODONE-ACETAMINOPHEN 5-325 MG PO TABS: 1.0000 | ORAL_TABLET | ORAL | Status: DC | PRN

## 2013-07-06 MED ORDER — TRAMADOL HCL 50 MG PO TABS
50.0000 mg | ORAL_TABLET | Freq: Four times a day (QID) | ORAL | Status: DC | PRN
  Administered 2013-07-07: 50 mg via ORAL
  Filled 2013-07-06: qty 1

## 2013-07-06 MED ORDER — FENTANYL CITRATE 0.05 MG/ML IJ SOLN
INTRAMUSCULAR | Status: DC | PRN
  Administered 2013-07-06: 50 ug via INTRAVENOUS
  Administered 2013-07-06: 25 ug via INTRAVENOUS
  Administered 2013-07-06: 50 ug via INTRAVENOUS
  Administered 2013-07-06: 250 ug via INTRAVENOUS

## 2013-07-06 MED ORDER — ROCURONIUM BROMIDE 100 MG/10ML IV SOLN
INTRAVENOUS | Status: DC | PRN
  Administered 2013-07-06: 10 mg via INTRAVENOUS
  Administered 2013-07-06: 40 mg via INTRAVENOUS

## 2013-07-06 MED ORDER — SODIUM CHLORIDE 0.9 % IJ SOLN: 9.0000 mL | INTRAMUSCULAR | Status: DC | PRN

## 2013-07-06 MED ORDER — NALOXONE HCL 0.4 MG/ML IJ SOLN: 0.4000 mg | INTRAMUSCULAR | Status: DC | PRN

## 2013-07-06 MED ORDER — DEXTROSE 5 % IV SOLN
1.5000 g | Freq: Two times a day (BID) | INTRAVENOUS | Status: AC
  Administered 2013-07-06 – 2013-07-07 (×2): 1.5 g via INTRAVENOUS
  Filled 2013-07-06 (×3): qty 1.5

## 2013-07-06 MED ORDER — OXYCODONE HCL 5 MG/5ML PO SOLN: 5.0000 mg | Freq: Once | ORAL | Status: DC | PRN

## 2013-07-06 MED ORDER — SODIUM CHLORIDE 0.9 % IJ SOLN
OROMUCOSAL | Status: DC | PRN
  Administered 2013-07-06: 14:00:00 via TOPICAL

## 2013-07-06 MED ORDER — ONDANSETRON HCL 4 MG/2ML IJ SOLN: 4.0000 mg | Freq: Once | INTRAMUSCULAR | Status: DC | PRN

## 2013-07-06 MED ORDER — ARTIFICIAL TEARS OP OINT
TOPICAL_OINTMENT | OPHTHALMIC | Status: DC | PRN
  Administered 2013-07-06: 1 via OPHTHALMIC

## 2013-07-06 MED ORDER — LIDOCAINE HCL (CARDIAC) 20 MG/ML IV SOLN
INTRAVENOUS | Status: DC | PRN
  Administered 2013-07-06: 100 mg via INTRAVENOUS

## 2013-07-06 MED ORDER — 0.9 % SODIUM CHLORIDE (POUR BTL) OPTIME
TOPICAL | Status: DC | PRN
  Administered 2013-07-06: 1000 mL

## 2013-07-06 MED ORDER — GLYCOPYRROLATE 0.2 MG/ML IJ SOLN
INTRAMUSCULAR | Status: DC | PRN
  Administered 2013-07-06: .6 mg via INTRAVENOUS

## 2013-07-06 MED ORDER — ONDANSETRON HCL 4 MG/2ML IJ SOLN
INTRAMUSCULAR | Status: DC | PRN
  Administered 2013-07-06: 4 mg via INTRAVENOUS

## 2013-07-06 MED ORDER — PROPOFOL 10 MG/ML IV BOLUS
INTRAVENOUS | Status: DC | PRN
  Administered 2013-07-06: 120 mg via INTRAVENOUS

## 2013-07-06 MED ORDER — LACTATED RINGERS IV SOLN
INTRAVENOUS | Status: DC
  Administered 2013-07-06: 09:00:00 via INTRAVENOUS

## 2013-07-06 MED ORDER — LACTATED RINGERS IV SOLN
INTRAVENOUS | Status: DC | PRN
  Administered 2013-07-06: 12:00:00 via INTRAVENOUS

## 2013-07-06 MED ORDER — DIPHENHYDRAMINE HCL 50 MG/ML IJ SOLN: 12.5000 mg | Freq: Four times a day (QID) | INTRAMUSCULAR | Status: DC | PRN

## 2013-07-06 MED ORDER — ACETAMINOPHEN 160 MG/5ML PO SOLN: 1000.0000 mg | Freq: Four times a day (QID) | ORAL | Status: AC

## 2013-07-06 MED ORDER — HYDROMORPHONE HCL PF 1 MG/ML IJ SOLN
0.2500 mg | INTRAMUSCULAR | Status: DC | PRN
  Administered 2013-07-06: 0.5 mg via INTRAVENOUS

## 2013-07-06 MED ORDER — SODIUM CHLORIDE 0.9 % IV SOLN
INTRAVENOUS | Status: DC | PRN
  Administered 2013-07-06: 13:00:00 via INTRAVENOUS

## 2013-07-06 MED ORDER — MEPERIDINE HCL 25 MG/ML IJ SOLN: 6.2500 mg | INTRAMUSCULAR | Status: DC | PRN

## 2013-07-06 MED ORDER — OXYCODONE HCL 5 MG PO TABS: 5.0000 mg | ORAL_TABLET | ORAL | Status: AC | PRN

## 2013-07-06 MED ORDER — POTASSIUM CHLORIDE 10 MEQ/50ML IV SOLN
10.0000 meq | Freq: Every day | INTRAVENOUS | Status: DC | PRN
  Filled 2013-07-06: qty 50

## 2013-07-06 MED ORDER — LACTATED RINGERS IV SOLN: INTRAVENOUS | Status: DC | PRN

## 2013-07-06 MED ORDER — SODIUM CHLORIDE 0.9 % IJ SOLN: 3.0000 mL | INTRAMUSCULAR | Status: DC | PRN

## 2013-07-06 MED ORDER — CHLORHEXIDINE GLUCONATE CLOTH 2 % EX PADS
6.0000 | MEDICATED_PAD | Freq: Every day | CUTANEOUS | Status: DC
  Administered 2013-07-06 – 2013-07-07 (×2): 6 via TOPICAL

## 2013-07-06 MED ORDER — ONDANSETRON HCL 4 MG/2ML IJ SOLN: 4.0000 mg | Freq: Four times a day (QID) | INTRAMUSCULAR | Status: DC | PRN

## 2013-07-06 MED ORDER — HYDROMORPHONE HCL PF 1 MG/ML IJ SOLN
INTRAMUSCULAR | Status: AC
  Filled 2013-07-06: qty 1

## 2013-07-06 MED ORDER — KCL IN DEXTROSE-NACL 20-5-0.45 MEQ/L-%-% IV SOLN
INTRAVENOUS | Status: DC
  Administered 2013-07-06: 100 mL/h via INTRAVENOUS
  Administered 2013-07-07: 07:00:00 via INTRAVENOUS
  Filled 2013-07-06 (×3): qty 1000

## 2013-07-06 MED ORDER — OXYCODONE HCL 5 MG PO TABS: 5.0000 mg | ORAL_TABLET | Freq: Once | ORAL | Status: DC | PRN

## 2013-07-06 MED ORDER — ACETAMINOPHEN 500 MG PO TABS
1000.0000 mg | ORAL_TABLET | Freq: Four times a day (QID) | ORAL | Status: AC
  Administered 2013-07-06 – 2013-07-07 (×3): 1000 mg via ORAL
  Administered 2013-07-07: 500 mg via ORAL
  Filled 2013-07-06 (×5): qty 2

## 2013-07-06 SURGICAL SUPPLY — 45 items
BLADE CORE FAN STRYKER (BLADE) ×2 IMPLANT
BRUSH CYTOL CELLEBRITY 1.5X140 (MISCELLANEOUS) IMPLANT
CANISTER SUCTION 2500CC (MISCELLANEOUS) ×2 IMPLANT
CATH FOLEY 16FR TEMP PROBE (CATHETERS) ×2 IMPLANT
CATH THORACIC 28FR (CATHETERS) ×2 IMPLANT
CONN ST 1/4X3/8  BEN (MISCELLANEOUS) ×1
CONN ST 1/4X3/8 BEN (MISCELLANEOUS) ×1 IMPLANT
CONT SPEC 4OZ CLIKSEAL STRL BL (MISCELLANEOUS) ×4 IMPLANT
COVER SURGICAL LIGHT HANDLE (MISCELLANEOUS) ×4 IMPLANT
COVER TABLE BACK 60X90 (DRAPES) ×2 IMPLANT
DRAPE CARDIOVASCULAR INCISE (DRAPES) ×1
DRAPE LAPAROSCOPIC ABDOMINAL (DRAPES) IMPLANT
DRAPE SRG 135X102X78XABS (DRAPES) ×1 IMPLANT
DRSG AQUACEL AG ADV 3.5X14 (GAUZE/BANDAGES/DRESSINGS) ×2 IMPLANT
DRSG MEPILEX BORDER 4X8 (GAUZE/BANDAGES/DRESSINGS) ×2 IMPLANT
ELECT REM PT RETURN 9FT ADLT (ELECTROSURGICAL) ×2
ELECTRODE REM PT RTRN 9FT ADLT (ELECTROSURGICAL) ×1 IMPLANT
FORCEPS BIOP RJ4 1.8 (CUTTING FORCEPS) IMPLANT
GOWN STRL NON-REIN LRG LVL3 (GOWN DISPOSABLE) ×8 IMPLANT
HEMOSTAT POWDER SURGIFOAM 1G (HEMOSTASIS) ×6 IMPLANT
KIT BASIN OR (CUSTOM PROCEDURE TRAY) ×2 IMPLANT
KIT ROOM TURNOVER OR (KITS) ×2 IMPLANT
KIT SUCTION CATH 14FR (SUCTIONS) IMPLANT
MARKER SKIN DUAL TIP RULER LAB (MISCELLANEOUS) ×2 IMPLANT
NEEDLE BIOPSY TRANSBRONCH 21G (NEEDLE) IMPLANT
NS IRRIG 1000ML POUR BTL (IV SOLUTION) ×4 IMPLANT
OIL SILICONE PENTAX (PARTS (SERVICE/REPAIRS)) ×2 IMPLANT
PACK OPEN HEART (CUSTOM PROCEDURE TRAY) IMPLANT
PAD ARMBOARD 7.5X6 YLW CONV (MISCELLANEOUS) ×4 IMPLANT
SPONGE GAUZE 4X4 12PLY (GAUZE/BANDAGES/DRESSINGS) ×2 IMPLANT
SPONGE INTESTINAL PEANUT (DISPOSABLE) ×2 IMPLANT
SUT SILK 2 0 SH CR/8 (SUTURE) ×2 IMPLANT
SUT STEEL 6MS V (SUTURE) ×2 IMPLANT
SUT STEEL SZ 6 DBL 3X14 BALL (SUTURE) IMPLANT
SUT VIC AB 1 CTX 27 (SUTURE) ×4 IMPLANT
SUT VIC AB 2-0 CTX 36 (SUTURE) ×4 IMPLANT
SUT VIC AB 3-0 X1 27 (SUTURE) ×4 IMPLANT
SYR 20ML ECCENTRIC (SYRINGE) ×2 IMPLANT
SYSTEM SAHARA CHEST DRAIN RE-I (WOUND CARE) ×2 IMPLANT
TAPE CLOTH SURG 4X10 WHT LF (GAUZE/BANDAGES/DRESSINGS) ×2 IMPLANT
TOWEL OR 17X24 6PK STRL BLUE (TOWEL DISPOSABLE) ×2 IMPLANT
TOWEL OR 17X26 10 PK STRL BLUE (TOWEL DISPOSABLE) ×4 IMPLANT
TRAP SPECIMEN MUCOUS 40CC (MISCELLANEOUS) ×2 IMPLANT
TUBE CONNECTING 12X1/4 (SUCTIONS) ×4 IMPLANT
WATER STERILE IRR 1000ML POUR (IV SOLUTION) ×2 IMPLANT

## 2013-07-06 NOTE — Brief Op Note (Addendum)
      [address]       [place] [address]             [phone]      07/06/2013  3:07 PM  PATIENT:  [name]  37 y.o. 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

## 2013-07-06 NOTE — H&P (Signed)
[address]       [place] [address]             [phone]                           [name] Medical Record #[id number] Date of Birth: 03-Aug-1976  Referring: Magrinat, Valentino Hue, MD Primary Care: [name], MD  Chief Complaint:    . 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 He came for medical attention when he noted increasing shortness of breath when climbing stairs over the past several days. Chest x-ray was normal d-dimer was mildly elevated. For this reason a CT scan of the chest was done to rule out pulmonary emboli. 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     Current Activity/ Functional Status:  Patient is independent with mobility/ambulation, transfers, ADL's, IADL's.  Zubrod Score: At the time of surgery this patient's most appropriate activity status/level should be described as: []  Normal activity, no symptoms [x]  Symptoms, fully ambulatory []  Symptoms, in bed less than or equal to 50% of the time []  Symptoms, in bed greater than 50% of the time but less than 100% []  Bedridden []  Moribund   Past Medical History  Diagnosis Date  . Laryngomalacia, congenital     resolved  . Dermoid cyst of eyelid     removed from left upper canthus area  . Shortness of breath   . Bronchitis 06/30/13    Past Surgical History  Procedure Laterality Date  . Tracheotomy     . Minor hemorrhoidectomy        History   Social History  . Marital Status: Married    Spouse Name: [name]    Number of Children: 49 son  52 years old  . Years of Education: MD   Occupational History  . 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 Smoking status: Former Smoker -- 0.25 packs/day for 3 years    Types: Cigarettes    Quit date: 07/03/2013  . Smokeless tobacco: Never Used  . Alcohol Use: Yes     Comment: socially   . Drug Use: No      History  Smoking status  . Former Smoker -- 0.25 packs/day for 3 years  . Types: Cigarettes  . Quit date: 07/03/1994  Smokeless tobacco  . Never Used    History  Alcohol Use  . Yes    Comment: socially      No Known Allergies  Current Facility-Administered Medications  Medication Dose Route Frequency Provider Last Rate Last Dose  . cefUROXime (ZINACEF) 1.5 g in dextrose 5 % 50 mL IVPB  1.5 g Intravenous 60 min Pre-Op [name], MD      . lactated ringers infusion   Intravenous Continuous Arta Bruce, MD 50 mL/hr at 07/06/13 0831    . mupirocin ointment (BACTROBAN) 2 %   Nasal BID [name], MD   1 application at 07/06/13 0830   Facility-Administered Medications Ordered in Other Encounters  Medication Dose Route Frequency Provider  Last Rate Last Dose  . lactated ringers infusion    Continuous PRN Tyrone Nine, CRNA        Family History: Patient's mother is alive has had a history of hip replacement and depression she is a physician in [place], father had CAD in 55 at age 67, paternal grandfather had history of prostate cancer maternal grandmother renal failure and diabetes paternal grandmother died of abdominal mass. Patient has one brother who is healthy with the exception of smoking. One 18-year-old child who is healthy   Review of Systems:     Cardiac Review of Systems: Y or N  Chest Pain [  y  ]  Resting SOB [  n ] Exertional SOB  [ y ]  Pollyann Kennedy Milo.Brash  ]   Pedal Edema [ n  ]    Palpitations [ n] Syncope  [ n ]   Presyncope [n  ]  General Review of Systems: [Y] = yes [  ]=no Constitional: recent weight change [ n ]; anorexia [n  ]; fatigue Cove.Etienne  ]; nausea [  n]; night sweats [ n ]; fever [ n ]; or chills [ y ];                                                                                                                                           Dental: poor dentition[n  ]; Last Dentist visit:   Eye : blurred vision [n  ]; diplopia [ n ]; vision changes [ n];  Amaurosis fugax[  n]; Resp: cough [  ];  wheezing[ n ];  hemoptysis[n  ]; shortness of breath[y  ]; paroxysmal nocturnal dyspnea[  ]; dyspnea on exertion[ y ]; or orthopnea[  ];  GI:  gallstones[  ], vomiting[  ];  dysphagia[  ]; melena[  ];  hematochezia [  ]; heartburn[  ];   Hx of  Colonoscopy[  ]; GU: kidney stones [  ]; hematuria[  ];   dysuria [  ];  nocturia[  ];  history of     obstruction [  ]; urinary frequency [  ]             Skin: rash, swelling[  ];, hair loss[  ];  peripheral edema[  ];  or itching[  ]; Musculosketetal: myalgias[  ];  joint swelling[  ];  joint erythema[  ];  joint pain[  ];  back pain[  ];  Heme/Lymph: bruising[  ];  bleeding[  ];  anemia[  ];  Neuro: TIA[  ];  headaches[  ];  stroke[  ];  vertigo[  ];  seizures[  ];   paresthesias[  ];  difficulty walking[n  ];  Psych:depression[  ]; anxiety[  ];  Endocrine: diabetes[  ];  thyroid dysfunction[  ];  Immunizations: Flu Cove.Etienne  ]; Pneumococcal[n  ];  Other:  Physical Exam: BP  106/57  Pulse 51  Temp(Src) 98 F (36.7 C) (Oral)  Resp 18  SpO2 100%  General appearance: alert, cooperative and appears stated age Neurologic: intact Heart: regular rate and rhythm, S1, S2 normal, no murmur, click, rub or gallop Lungs: clear to auscultation bilaterally and normal percussion bilaterally Abdomen: soft, non-tender; bowel sounds normal; no masses,  no organomegaly Extremities: extremities normal, atraumatic, no cyanosis or edema and Homans sign is negative, no sign of DVT Normal male genitalia without evidence of testicular mass no inguinal adenopathy or hernias Scarring in the suprasternal notch from previous tracheal surgery No cervical or supraclavicular  adenopathy  Diagnostic Studies & Laboratory data:     Recent Radiology Findings:   Dg Chest 2 View  07/02/2013   CLINICAL DATA:  Chest pain with respiration. Some neck pain.  EXAM: CHEST  2 VIEW  COMPARISON:  None.  FINDINGS: Lungs are adequately inflated without consolidation or effusion. Cardiomediastinal silhouette is within normal. There is minimal spondylosis of the spine.  IMPRESSION: No acute cardiopulmonary disease.   Electronically Signed   By: Elberta Fortis M.D.   On: 07/02/2013 16:30   Ct Angio Chest Pe W/cm &/or Wo Cm  07/02/2013   CLINICAL DATA:  Initial encounter for chest pain and shortness of breath. Elevated D-dimer.  EXAM: CT ANGIOGRAPHY CHEST WITH CONTRAST  TECHNIQUE: Multidetector CT imaging of the chest was performed using the standard protocol during bolus administration of intravenous contrast. Multiplanar CT image reconstructions including MIPs were obtained to evaluate the vascular anatomy.  CONTRAST:  80mL OMNIPAQUE IOHEXOL 350 MG/ML IV.  COMPARISON:  None.  FINDINGS: Contrast opacification of the pulmonary arteries is very good. No filling defects within either main pulmonary artery or their branches in either lung to suggest pulmonary embolism. Heart size normal. No pericardial effusion. No visible coronary atherosclerosis. Mild calcified plaque in the proximal descending thoracic aorta, without evidence of atherosclerosis elsewhere.  Mass in the anterior superior mediastinum measuring approximately 3.7 x 5.9 x 4.8 cm (series 5, image 37 and series 9, image 49). No mass or lymphadenopathy elsewhere in the mediastinum, either hilum, or either axilla. Visualized thyroid gland unremarkable.  Pulmonary parenchyma clear without evidence of localized airspace consolidation, interstitial disease, or parenchymal nodules or masses. No pleural effusions. Central airways patent with mild bronchial wall thickening.  Moderate bilateral gynecomastia. Possible hepatosplenomegaly, though these  organs are incompletely imaged. Calcified granuloma in the anterior segment right lobe of liver. Visualized upper abdomen otherwise unremarkable. Bone window images demonstrate moderate thoracic spondylosis.  Review of the MIP images confirms the above findings.  IMPRESSION: 1. No evidence of pulmonary embolism. 2. Anterior superior mediastinal mass, measured above, without evidence of mass or lymphadenopathy elsewhere. Differential diagnosis would include thymoma, lymphoma, and teratoma. 3. Mild central bronchial wall thickening is consistent with asthma and/or bronchitis. No acute cardiopulmonary disease otherwise. 4. Moderate bilateral gynecomastia. 5. Possible hepatosplenomegaly, though these organs were not completely imaged on the chest CT. 6. Minimal calcified plaque in the proximal descending thoracic aorta, indicating advanced atherosclerosis for age.   Electronically Signed   By: Hulan Saas M.D.   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  EXAM: CT ABDOMEN AND PELVIS WITH CONTRAST  TECHNIQUE: Multidetector CT imaging of the abdomen and pelvis was performed using the standard protocol following bolus administration of intravenous contrast.  CONTRAST:  OMNIPAQUE IOHEXOL 300 MG/ML  SOLN  COMPARISON:  Chest CT 07/02/2013.  FINDINGS: The solid abdominal organs are normal. The No mass lesions or evidence of metastatic disease. The gallbladder is normal. No common bowel duct dilatation.  The stomach, duodenum, small bowel and colon are unremarkable. No inflammatory changes, mass lesions or obstructive findings. The appendix is normal. No mesenteric or retroperitoneal mass or adenopathy. The aorta and branch vessels are normal. The portal, hepatic, splenic and renal veins are patent.  The bladder, prostate gland and seminal vesicles are unremarkable. No pelvic mass, adenopathy or free pelvic fluid collections. No  inguinal mass or adenopathy.  The bony structures are unremarkable. Bilateral pars defects are noted at L5 without spondylolisthesis. No bone lesions. There are degenerative changes noted in the lower thoracic spine.  IMPRESSION: Unremarkable CT abdomen/ pelvis. No findings for metastatic disease.   Electronically Signed   By: Loralie Champagne M.D.   On: 07/03/2013 12:23      Recent Lab Findings: Lab Results  Component Value Date   WBC 9.8 07/02/2013   HGB 13.2 07/02/2013   HCT 40.1 07/02/2013   PLT 250 07/02/2013   GLUCOSE 92 07/02/2013   ALT 36 07/02/2013   AST 54* 07/02/2013   NA 135 07/02/2013   K 4.1 07/02/2013   CL 99 07/02/2013   CREATININE 0.82 07/02/2013   BUN 13 07/02/2013   CO2 25 07/02/2013   INR 0.91 07/05/2013      Assessment / Plan:   Mass in the anterior superior mediastinum measuring approximately 3.7 x 5.9 x 4.8 cm, negative alpha-fetoprotein and beta hCG, anti-acetylcholine receptor (AChR) antibody  pending Differential diagnosis would include thymoma, thymic carcinoma, lymphoma, germ cell tumour , teratoma No CT evidence of other chest or abdominal adenopathy.  I reviewed the radiographic findings in detail with the patient and his wife. The anterior mediastinal mass appears separate from the thyroid, is large but is not obviously invading surrounding structures by CT. I recommended to the patient that we proceed with sternotomy or partial sternotomy and midline resection of the mass both for treatment and diagnostic reasons. The procedures described in detail and the patient is willing to proceed.  The goals risks and alternatives of the planned surgical procedure resection of mediastinal mass via sternotomy or partial sternotomy   have been discussed with the patient in detail. The risks of the procedure including death, infection, stroke, myocardial infarction, bleeding, blood transfusion have all been discussed specifically.  I have quoted [name] a 2% of  perioperative mortality and a complication rate as high as 15 %. The patient's questions have been answered.[name] is willing  to proceed with the planned procedure.    [name] MD      [address] [place] [place] [address] Office [phone]   Beeper [phone]  07/06/2013 12:15 PM

## 2013-07-06 NOTE — Anesthesia Postprocedure Evaluation (Signed)
Anesthesia Post Note  Patient: [name]  Procedure(s) Performed: Procedure(s) (LRB): VIDEO BRONCHOSCOPY (N/A) PARTIAL MEDIASTERNOTOMY//RESECTION OF ANTERIOR MEDIASTINAL MASS (N/A)  Anesthesia type: general  Patient location: PACU  Post pain: Pain level controlled  Post assessment: Patient's Cardiovascular Status Stable  Last Vitals:  Filed Vitals:   07/06/13 1700  BP:   Pulse: 57  Temp:   Resp: 15    Post vital signs: Reviewed and stable  Level of consciousness: sedated  Complications: No apparent anesthesia complications

## 2013-07-06 NOTE — Transfer of Care (Signed)
Immediate Anesthesia Transfer of Care Note  Patient: [name]  Procedure(s) Performed: Procedure(s) with comments: VIDEO BRONCHOSCOPY (N/A) PARTIAL MEDIASTERNOTOMY//RESECTION OF ANTERIOR MEDIASTINAL MASS (N/A) - RESECTION OF ANTERIOR MEDIASTINAL MASS  Patient Location: PACU  Anesthesia Type:General  Level of Consciousness: awake, alert  and oriented  Airway & Oxygen Therapy: Patient Spontanous Breathing and Patient connected to face mask oxygen  Post-op Assessment: Report given to PACU RN, Post -op Vital signs reviewed and stable and Patient moving all extremities X 4  Post vital signs: Reviewed and stable  Complications: No apparent anesthesia complications

## 2013-07-06 NOTE — Preoperative (Signed)
Beta Blockers   Reason not to administer Beta Blockers:Not Applicable 

## 2013-07-06 NOTE — Progress Notes (Signed)
PM ROUNDS  Resting comfortably  BP 109/58  Pulse 54  Temp(Src) 97.9 F (36.6 C) (Oral)  Resp 19  Ht 5' 6.14" (1.68 m)  Wt 159 lb 9.8 oz (72.4 kg)  BMI 25.65 kg/m2  SpO2 100%   Intake/Output Summary (Last 24 hours) at 07/06/13 1854 Last data filed at 07/06/13 1800  Gross per 24 hour  Intake   2845 ml  Output    325 ml  Net   2520 ml   Stable early postop

## 2013-07-06 NOTE — Anesthesia Procedure Notes (Signed)
Procedure Name: Intubation Date/Time: 07/06/2013 12:23 PM Performed by: Tyrone Nine Pre-anesthesia Checklist: Patient identified, Timeout performed, Emergency Drugs available, Suction available and Patient being monitored Patient Re-evaluated:Patient Re-evaluated prior to inductionOxygen Delivery Method: Circle system utilized Preoxygenation: Pre-oxygenation with 100% oxygen Intubation Type: IV induction Ventilation: Mask ventilation without difficulty Laryngoscope Size: Mac and 3 Grade View: Grade I Tube type: Oral Tube size: 8.0 mm Number of attempts: 1 Airway Equipment and Method: Stylet Placement Confirmation: ETT inserted through vocal cords under direct vision,  positive ETCO2 and breath sounds checked- equal and bilateral Secured at: 22 cm Tube secured with: Tape Dental Injury: Teeth and Oropharynx as per pre-operative assessment  Comments: Pt with history of previous trach.   Intubated Grade 1 view and no resistance met when # 8.0  ETT thru cricothyroid cartiledge.

## 2013-07-07 ENCOUNTER — Inpatient Hospital Stay (HOSPITAL_COMMUNITY): Payer: PRIVATE HEALTH INSURANCE

## 2013-07-07 LAB — POCT I-STAT 3, ART BLOOD GAS (G3+)
Bicarbonate: 24.2 mEq/L — ABNORMAL HIGH (ref 20.0–24.0)
O2 Saturation: 97 %
Patient temperature: 98.5
TCO2: 25 mmol/L (ref 0–100)
pCO2 arterial: 38.2 mmHg (ref 35.0–45.0)
pH, Arterial: 7.41 (ref 7.350–7.450)
pO2, Arterial: 95 mmHg (ref 80.0–100.0)

## 2013-07-07 LAB — CBC
Hemoglobin: 12.4 g/dL — ABNORMAL LOW (ref 13.0–17.0)
RBC: 4.58 MIL/uL (ref 4.22–5.81)
WBC: 8.9 10*3/uL (ref 4.0–10.5)

## 2013-07-07 LAB — BASIC METABOLIC PANEL
CO2: 26 mEq/L (ref 19–32)
Chloride: 105 mEq/L (ref 96–112)
Glucose, Bld: 140 mg/dL — ABNORMAL HIGH (ref 70–99)
Potassium: 4 mEq/L (ref 3.5–5.1)
Sodium: 139 mEq/L (ref 135–145)

## 2013-07-07 MED ORDER — IBUPROFEN 800 MG PO TABS
800.0000 mg | ORAL_TABLET | Freq: Four times a day (QID) | ORAL | Status: DC | PRN
  Administered 2013-07-07 – 2013-07-08 (×2): 800 mg via ORAL
  Filled 2013-07-07 (×2): qty 1

## 2013-07-07 NOTE — Progress Notes (Signed)
1 Day Post-Op Procedure(s) (LRB): VIDEO BRONCHOSCOPY (N/A) PARTIAL MEDIASTERNOTOMY//RESECTION OF ANTERIOR MEDIASTINAL MASS (N/A) Subjective: POD # 1 Some incisional pain but mostly from chest tube  Objective: Vital signs in last 24 hours: Temp:  [97.6 F (36.4 C)-98.5 F (36.9 C)] 98 F (36.7 C) (11/08 0733) Pulse Rate:  [49-85] 49 (11/08 0800) Cardiac Rhythm:  [-] Normal sinus rhythm (11/08 0744) Resp:  [9-27] 21 (11/08 0800) BP: (82-125)/(33-73) 113/63 mmHg (11/08 0800) SpO2:  [98 %-100 %] 99 % (11/08 0800) Arterial Line BP: (90-169)/(51-110) 169/95 mmHg (11/08 0600) Weight:  [159 lb 9.8 oz (72.4 kg)-169 lb 5 oz (76.8 kg)] 169 lb 5 oz (76.8 kg) (11/08 0600)  Hemodynamic parameters for last 24 hours:    Intake/Output from previous day: 11/07 0701 - 11/08 0700 In: 5558 [P.O.:1680; I.V.:3828; IV Piggyback:50] Out: 4085 [Urine:3945; Blood:100; Chest Tube:40] Intake/Output this shift:    General appearance: alert and no distress Neurologic: intact Heart: regular rate and rhythm Lungs: diminished breath sounds bilaterally no air leak  Lab Results:  Recent Labs  07/07/13 0439  WBC 8.9  HGB 12.4*  HCT 35.7*  PLT 274   BMET:  Recent Labs  07/07/13 0439  NA 139  K 4.0  CL 105  CO2 26  GLUCOSE 140*  BUN 9  CREATININE 0.70  CALCIUM 8.9    PT/INR:  Recent Labs  07/05/13 1259  LABPROT 12.1  INR 0.91   ABG    Component Value Date/Time   PHART 7.410 07/07/2013 0442   HCO3 24.2* 07/07/2013 0442   TCO2 25 07/07/2013 0442   O2SAT 97.0 07/07/2013 0442   CBG (last 3)  No results found for this basename: GLUCAP,  in the last 72 hours  Assessment/Plan: S/P Procedure(s) (LRB): VIDEO BRONCHOSCOPY (N/A) PARTIAL MEDIASTERNOTOMY//RESECTION OF ANTERIOR MEDIASTINAL MASS (N/A) Plan for transfer to step-down: see transfer orders Doing well POD # 1 DC CT Requesting ibuprofen and acetaminophen rather than narcotics OOB, ambulate Transfer to 2000   LOS: 1 day     [name] C 07/07/2013

## 2013-07-07 NOTE — Progress Notes (Signed)
Called Medical records, [facility] and attending on call (Dr. Dorris Fetch) due patient request to view his chart and show family.  Dr. Dorris Fetch said this was fine.  Will relay to Dr. Mahala Menghini. [name]

## 2013-07-07 NOTE — Progress Notes (Signed)
Wasted 50 mL PCA fentanyl syringe in sink with Patty, RN. Myley Bahner, [facility]

## 2013-07-08 ENCOUNTER — Inpatient Hospital Stay (HOSPITAL_COMMUNITY): Payer: PRIVATE HEALTH INSURANCE

## 2013-07-08 LAB — COMPREHENSIVE METABOLIC PANEL
AST: 28 U/L (ref 0–37)
Alkaline Phosphatase: 69 U/L (ref 39–117)
BUN: 8 mg/dL (ref 6–23)
CO2: 27 mEq/L (ref 19–32)
Calcium: 9.2 mg/dL (ref 8.4–10.5)
GFR calc Af Amer: >90 mL/min (ref >90)
GFR calc non Af Amer: >90 mL/min (ref >90)
Glucose, Bld: 121 mg/dL — ABNORMAL HIGH (ref 70–99)
Potassium: 4.8 mEq/L (ref 3.5–5.1)
Total Bilirubin: 0.4 mg/dL (ref 0.3–1.2)
Total Protein: 6.9 g/dL (ref 6.0–8.3)

## 2013-07-08 LAB — CBC
HCT: 37.6 % — ABNORMAL LOW (ref 39.0–52.0)
Hemoglobin: 12.5 g/dL — ABNORMAL LOW (ref 13.0–17.0)
MCH: 26.9 pg (ref 26.0–34.0)
MCHC: 33.2 g/dL (ref 30.0–36.0)
Platelets: 278 10*3/uL (ref 150–400)
WBC: 7.6 10*3/uL (ref 4.0–10.5)

## 2013-07-08 MED ORDER — TRAMADOL HCL 50 MG PO TABS: 50.0000 mg | ORAL_TABLET | Freq: Four times a day (QID) | ORAL | Status: DC | PRN

## 2013-07-08 MED ORDER — OXYCODONE-ACETAMINOPHEN 5-325 MG PO TABS: 1.0000 | ORAL_TABLET | ORAL | Status: DC | PRN

## 2013-07-08 MED ORDER — MUPIROCIN 2 % EX OINT: 1.0000 "application " | TOPICAL_OINTMENT | Freq: Two times a day (BID) | CUTANEOUS | Status: AC

## 2013-07-08 NOTE — Progress Notes (Signed)
Discharge instructions given to pt and pt's wife along with new prescriptions. Pt is stable to discharge with wife and family.

## 2013-07-08 NOTE — Progress Notes (Signed)
      [address]       [place] [address]             [phone]       2 Days Post-Op Procedure(s) (LRB): VIDEO BRONCHOSCOPY (N/A) PARTIAL MEDIASTERNOTOMY//RESECTION OF ANTERIOR MEDIASTINAL MASS (N/A)  Subjective: Patient with a little incisional pain. Otherwise, no complaints.  Objective: Vital signs in last 24 hours: Temp:  [97.8 F (36.6 C)-98.3 F (36.8 C)] 97.8 F (36.6 C) (11/09 0409) Pulse Rate:  [49-57] 54 (11/09 0409) Cardiac Rhythm:  [-] Other (Comment) (11/09 0333) Resp:  [18-21] 18 (11/09 0409) BP: (113-126)/(63-81) 126/81 mmHg (11/09 0409) SpO2:  [97 %-100 %] 98 % (11/09 0409)     Intake/Output from previous day: 11/08 0701 - 11/09 0700 In: 100 [I.V.:100] Out: 300 [Urine:300]   Physical Exam:  Cardiovascular: RRR, no murmurs, gallops, or rubs. Pulmonary: Clear to auscultation bilaterally; no rales, wheezes, or rhonchi. Extremities: No lower extremity edema. Wounds: Clean and dry.  No erythema or signs of infection.   Lab Results: CBC: Recent Labs  07/07/13 0439 07/08/13 0520  WBC 8.9 7.6  HGB 12.4* 12.5*  HCT 35.7* 37.6*  PLT 274 278   BMET:  Recent Labs  07/07/13 0439 07/08/13 0520  NA 139 142  K 4.0 4.8  CL 105 106  CO2 26 27  GLUCOSE 140* 121*  BUN 9 8  CREATININE 0.70 0.78  CALCIUM 8.9 9.2    PT/INR:  Recent Labs  07/05/13 1259  LABPROT 12.1  INR 0.91   ABG:  INR: Will add last result for INR, ABG once components are confirmed Will add last 4 CBG results once components are confirmed  Assessment/Plan:  1. CV - SR 2.  Pulmonary - Chest tube removed yesterday. CXR this am appears to show no pneumothorax and lungs are clear. Frozen section positive for thymoma. Final pathology is still pending. 3. H and H stable at 12.5 and 37.6 4. Discharge later today  ZIMMERMAN,DONIELLE MPA-C 07/08/2013,7:46 AM

## 2013-07-08 NOTE — Discharge Summary (Signed)
Physician Discharge Summary       [address] [place]       [place] [address]             [phone]    Patient ID: [name] MRN: [id number] DOB/AGE: 09/22/1975 37 y.o.  Admit date: 07/06/2013 Discharge date: 07/08/2013  Admission Diagnoses: Anterior mediastinal mass  Discharge Diagnoses:  Anterior mediastinal mass  Procedure (s):  VIDEO BRONCHOSCOPY  PARTIAL STERNOTOMY, RESECTION OF ANTERIOR MEDIASTINAL MASS by Dr. Tyrone Sage on 07/06/2013   Pathology: Frozen section showed thymoma. Final pathology results are pending.  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 He came for medical attention when he noted increasing shortness of breath when climbing stairs over the past several days. Chest x-ray was normal d-dimer was mildly elevated. For this reason, a CT scan of the chest was done to rule out pulmonary emboli. 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 Dr. Tyrone Sage discussed the need for a partial sternotomy in order to resect the anterior mediastinal mass. Potential risks, complications, and benefits of the surgery were discussed with the patient and he agreed to proceed. He was admitted on 07/06/2013 in order to undergo the aforementioned surgery.  Brief Hospital Course:  He remained afebrile and hemodynamically stable. His chest tube output decreased and his chest x ray remained stable. His chest tube was removed on 11/8. He has been tolerating a diet. He is ambulating on room air. His wound is clean, dry and continuing to heal. There are no signs of wound infection. He is felt surgically stable for discharge today.   Latest Vital Signs: Blood pressure 126/81, pulse 54, temperature 97.8 F  (36.6 C), temperature source Oral, resp. rate 18, height 5' 6.14" (1.68 m), weight 76.8 kg (169 lb 5 oz), SpO2 98.00%.  Physical Exam: Cardiovascular: RRR, no murmurs, gallops, or rubs.  Pulmonary: Clear to auscultation bilaterally; no rales, wheezes, or rhonchi.  Extremities: No lower extremity edema.  Wounds: Clean and dry. No erythema or signs of infection.   Discharge Condition:Stable  Recent laboratory studies:  Lab Results  Component Value Date   WBC 7.6 07/08/2013   HGB 12.5* 07/08/2013   HCT 37.6* 07/08/2013   MCV 80.9 07/08/2013   PLT 278 07/08/2013   Lab Results  Component Value Date   NA 142 07/08/2013   K 4.8 07/08/2013   CL 106 07/08/2013   CO2 27 07/08/2013   CREATININE 0.78 07/08/2013   GLUCOSE 121* 07/08/2013      Diagnostic Studies:  Ct Angio Chest Pe W/cm &/or Wo Cm  07/02/2013   CLINICAL DATA:  Initial encounter for chest pain and shortness of breath. Elevated D-dimer.  EXAM: CT ANGIOGRAPHY CHEST WITH CONTRAST  TECHNIQUE: Multidetector CT imaging of the chest was performed using the standard protocol during bolus administration of intravenous contrast. Multiplanar CT image reconstructions including MIPs were obtained to evaluate the vascular anatomy.  CONTRAST:  80mL OMNIPAQUE IOHEXOL 350 MG/ML IV.  COMPARISON:  None.  FINDINGS: Contrast opacification of the pulmonary arteries is very good. No filling defects within either main pulmonary artery or their branches in either lung to suggest pulmonary embolism. Heart size normal. No pericardial effusion. No visible coronary atherosclerosis. Mild calcified plaque in  the proximal descending thoracic aorta, without evidence of atherosclerosis elsewhere.  Mass in the anterior superior mediastinum measuring approximately 3.7 x 5.9 x 4.8 cm (series 5, image 37 and series 9, image 49). No mass or lymphadenopathy elsewhere in the mediastinum, either hilum, or either axilla. Visualized thyroid gland unremarkable.  Pulmonary parenchyma  clear without evidence of localized airspace consolidation, interstitial disease, or parenchymal nodules or masses. No pleural effusions. Central airways patent with mild bronchial wall thickening.  Moderate bilateral gynecomastia. Possible hepatosplenomegaly, though these organs are incompletely imaged. Calcified granuloma in the anterior segment right lobe of liver. Visualized upper abdomen otherwise unremarkable. Bone window images demonstrate moderate thoracic spondylosis.  Review of the MIP images confirms the above findings.  IMPRESSION: 1. No evidence of pulmonary embolism. 2. Anterior superior mediastinal mass, measured above, without evidence of mass or lymphadenopathy elsewhere. Differential diagnosis would include thymoma, lymphoma, and teratoma. 3. Mild central bronchial wall thickening is consistent with asthma and/or bronchitis. No acute cardiopulmonary disease otherwise. 4. Moderate bilateral gynecomastia. 5. Possible hepatosplenomegaly, though these organs were not completely imaged on the chest CT. 6. Minimal calcified plaque in the proximal descending thoracic aorta, indicating advanced atherosclerosis for age.   Electronically Signed   By: Hulan Saas M.D.   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  EXAM: CT ABDOMEN AND PELVIS WITH CONTRAST  TECHNIQUE: Multidetector CT imaging of the abdomen and pelvis was performed using the standard protocol following bolus administration of intravenous contrast.  CONTRAST:  OMNIPAQUE IOHEXOL 300 MG/ML  SOLN  COMPARISON:  Chest CT 07/02/2013.  FINDINGS: The solid abdominal organs are normal. The No mass lesions or evidence of metastatic disease. The gallbladder is normal. No common bowel duct dilatation.  The stomach, duodenum, small bowel and colon are unremarkable. No inflammatory changes, mass lesions or obstructive findings. The appendix is normal. No  mesenteric or retroperitoneal mass or adenopathy. The aorta and branch vessels are normal. The portal, hepatic, splenic and renal veins are patent.  The bladder, prostate gland and seminal vesicles are unremarkable. No pelvic mass, adenopathy or free pelvic fluid collections. No inguinal mass or adenopathy.  The bony structures are unremarkable. Bilateral pars defects are noted at L5 without spondylolisthesis. No bone lesions. There are degenerative changes noted in the lower thoracic spine.  IMPRESSION: Unremarkable CT abdomen/ pelvis. No findings for metastatic disease.   Electronically Signed   By: Loralie Champagne M.D.   On: 07/03/2013 12:23   Dg Chest Port 1 View  07/07/2013   CLINICAL DATA:  Chest tube post sternotomy/mass resection.  EXAM: PORTABLE CHEST - 1 VIEW  COMPARISON:  07/06/2013  FINDINGS: Sternotomy wires are unchanged. Mediastinal drain remains in place and unchanged. Lungs are clear. Cardiomediastinal silhouette and remainder of the exam is unchanged.  IMPRESSION: No acute cardiopulmonary disease.  Mediastinal drain unchanged.   Electronically Signed   By: Elberta Fortis M.D.   On: 07/07/2013 09:17      Future Appointments Provider Department Dept Phone   07/10/2013 8:00 AM Lowella Dell, MD [facility] [phone]      Discharge Medications:   Medication List         ibuprofen 400 MG tablet  Commonly known as:  ADVIL,MOTRIN  Take 400 mg by mouth every 6 (six) hours as needed.     mupirocin ointment 2 %  Commonly known as:  [employer]  Place 1 application into the nose 2 (two) times daily. Last dose 07/11/2013.     oxyCODONE-acetaminophen 5-325 MG per tablet  Commonly known as:  ROXICET  Take 1 tablet by mouth every 4 (four) hours as needed for severe pain.     traMADol 50 MG tablet  Commonly known as:  ULTRAM  Take 1-2 tablets (50-100 mg total) by mouth every 6 (six) hours as needed for moderate pain.        Follow Up  Appointments:     Follow-up Information   Follow up with GERHARDT,EDWARD B, MD. (PA/LAT CXR to be taken (at [facility] which is in the same building as Dr. Dennie Maizes office) 1 hour prior to office appintment;Office will mail appointment with Dr. Tyrone Sage)    Specialty:  Cardiothoracic Surgery   Contact information:   [address] [place] [address] [phone]       Signed: Doree Fudge MPA-C 07/08/2013, 8:36 AM

## 2013-07-09 ENCOUNTER — Telehealth: Payer: Self-pay | Admitting: Oncology

## 2013-07-09 ENCOUNTER — Encounter (HOSPITAL_COMMUNITY): Payer: Self-pay | Admitting: Cardiothoracic Surgery

## 2013-07-09 ENCOUNTER — Other Ambulatory Visit: Payer: Self-pay | Admitting: Oncology

## 2013-07-09 NOTE — Telephone Encounter (Signed)
Per note from GM moved 11/11 f/u to 11/12 @ 4:30pm.  S/w pt he is aware.

## 2013-07-10 ENCOUNTER — Ambulatory Visit: Payer: PRIVATE HEALTH INSURANCE | Admitting: Oncology

## 2013-07-10 LAB — MYASTHENIA GRAVIS PANEL 2: Aceytlcholine Rec Bloc Ab: <15 % of inhibition (ref <15)

## 2013-07-10 NOTE — Op Note (Signed)
[name], [name] NO.:  [id number]  MEDICAL RECORD NO.:  [id number]  LOCATION:  [id number]                        FACILITY:  [facility]  PHYSICIAN:  Sheliah Plane, MD    DATE OF BIRTH:  [birth year]  DATE OF PROCEDURE:  07/06/2013 DATE OF DISCHARGE:  07/08/2013                              OPERATIVE REPORT   PREOPERATIVE DIAGNOSIS:  Large anterior mediastinal tumor.  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  SURGICAL PROCEDURE:  Partial sternotomy with resection of anterior mediastinal tumor, bronchoscopy.  SURGEON:  Sheliah Plane, MD  FIRST ASSISTANT:  Coral Ceo, P.A.  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  A D-dimer was slightly elevated, leading to a CT scan of the chest to rule out pulmonary emboli.  No pulmonary emboli were noted, but the patient was found to have a 6 cm anterior mediastinal mass suggestive of possible thymoma.  The mass appeared well circumscribed.  Alpha-fetoprotein and beta HCG were negative.  He had no testicular masses.  He had no adenopathy on physical exam or on CT of the chest and abdomen.  Surgical resection was recommended to the patient, both for diagnosis and treatment.  He agreed and signed informed consent.  DESCRIPTION OF PROCEDURE:  The patient underwent general endotracheal anesthesia without incident.  As an infant, he had been had tracheomalacia and has a scar in the suprasternal notch from probable previous tracheostomy because of his history of tracheomalacia, although he is asymptomatic from this.  The fiberoptic bronchoscope was passed down the endotracheal tube with no abnormal findings in the trachea or down to the subsegmental level.  The scope was removed.  The chest was prepped with Betadine and draped in usual sterile manner.  An incision was made in the midline upper sternum to just  below the manubrium. Sagittal sternal saw was used to perform a partial sternotomy.  The laminar spreader was placed to retract the upper portion of the sternum apart.  Through this incision, we proceeded to carefully dissect out the large mediastinal mass.  The mass was easily dissected off of the pericardium without any evidence of invasion into the pericardium or into the innominate vein which was easily identified.  Along the right pleural surface, the pleura was adherent to the mass and a segment of pleura was removed.  This gave [place] good visualization of the right phrenic nerve, and care was taken to not injure it and it was not involved in the tumor at all.  With the mass completely excised, it was submitted to Pathology for frozen section, and on frozen section, it was thought to be most compatible with thymoma.  During the dissection, a less than 1 cm lymph node was also identified and submitted separately showed benign lymphoid tissue.  A Blake drain was placed through the right chest to evacuate air from the right chest, the end positioned in the superior anterior mediastinum and chest tube was secured in place. Four sternal wires were used to reapproximate the upper portion of the sternum.  Fascia was closed with interrupted 0 Vicryl, running 3-0 Vicryl  in the subcutaneous tissue, and 3-0 subcuticular stitch in the skin edges.  Dry dressing was applied.  Sponge and needle count was reported as correct at the completion of the procedure.  Estimated blood loss was 100 mL.  The patient tolerated the procedure without obvious complication and was extubated in the operating room and transferred to the recovery room for further postoperative care.     Sheliah Plane, MD     EG/MEDQ  D:  07/09/2013  T:  07/10/2013  Job:  [id number]  cc:   Lowella Dell, M.D.

## 2013-07-11 ENCOUNTER — Ambulatory Visit (HOSPITAL_BASED_OUTPATIENT_CLINIC_OR_DEPARTMENT_OTHER): Payer: PRIVATE HEALTH INSURANCE | Admitting: Oncology

## 2013-07-11 VITALS — BP 111/71 | HR 68 | Temp 98.4°F

## 2013-07-11 DIAGNOSIS — D150 Benign neoplasm of thymus: Secondary | ICD-10-CM

## 2013-07-11 DIAGNOSIS — G8918 Other acute postprocedural pain: Secondary | ICD-10-CM

## 2013-07-11 DIAGNOSIS — D4989 Neoplasm of unspecified behavior of other specified sites: Secondary | ICD-10-CM

## 2013-07-11 NOTE — Progress Notes (Signed)
ID: Rhetta Mura OB: [birth year]  MR#: [id number]  WUJ#:[id number]  PCP: Mark Grippe, MD GYN:   SU:  OTHER MD:  CHIEF COMPLAINT: "I'm having trouble breathing."  HISTORY OF PRESENT ILLNESS: [name] started feeling a little tired and short of breath about 3 days ago (end of October 20 14th). On November 1 she had myalgias and arthralgias and a temperature up to 99.0. He was feeling like a neck pain bilaterally, TMJ-like symptoms, but the main problem was that he couldn't take a deep breath. There was no pleurisy. There was no cough, phlegm production, or hemoptysis.   As the symptoms did not resolve he presented to the emergency room where a d-dimer was obtained. As this was elevated, a CT angiogram followed. 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 This was clearly separate from the thyroid gland. There was no obvious adenopathy.   With this information we were contacted. We suggested the obtaining of additional lab work, to include an alpha-fetoprotein and beta hCG. [name]'s subsequent history is as detailed below.  INTERVAL HISTORY: [name] returns today accompanied by his wife [name] to discuss results of his recent surgery. He underwent median sternotomy for thymoma removal of [name] seventh. The pathology report shows positive margins. It is not clear whether there was capsular invasion. There was an area of adherence to the pleura which was resected and is now described in the report. The histology is described as to be, but I do not have a clear statement of the criteria used for that determination. From the intraoperative note, the fact that there 16 pleural adhesion, even with inflammatory, does make this stage II (T2, N0, M0)  REVIEW OF SYSTEMS: [name] has post sternotomy pain, as expected. He is controlling this with Tylenol and occasionally Motrin. He does not like narcotics for numerous reasons among them difficulties he had  previously with constipation. He does describe the pain is between 3 and 5/10. There's been no bleeding or fever. He is able to take a good breast, though not as deep as he would like, of course is not allowed to drive or exercise his upper body. He is starting to take walks. A detailed review of systems was otherwise stable.  PAST MEDICAL HISTORY: Past Medical History  Diagnosis Date   Laryngomalacia, congenital     resolved   Dermoid cyst of eyelid     removed from left upper canthus area   Shortness of breath    Bronchitis 06/30/13    PAST SURGICAL HISTORY: Past Surgical History  Procedure Laterality Date   Tracheotomy      Minor hemorrhoidectomy     Video bronchoscopy N/A 07/06/2013    Procedure: VIDEO BRONCHOSCOPY;  Surgeon: Mark Ovens, MD;  Location: [facility] OR;  Service: Thoracic;  Laterality: N/A;   Mediasternotomy N/A 07/06/2013    Procedure: PARTIAL MEDIASTERNOTOMY//RESECTION OF ANTERIOR MEDIASTINAL MASS;  Surgeon: Mark Ovens, MD;  Location: [facility] OR;  Service: Thoracic;  Laterality: N/A;  RESECTION OF ANTERIOR MEDIASTINAL MASS    FAMILY HISTORY No family history on file. The patient's parents are living, in their mid 18s. The patient has one brother, no sisters. There is no history of cancer in the immediate family. The patient's father's father was diagnosed with prostate cancer in his 24s. The patient's father's mother was diagnosed with some intra-abdominal malignancy.  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 His wife [name] is a physical  therapist at [employer]. They have one son, [name], currently 59 years old. They tell me they do not intend to have any more children. The patient's cultur/re yearal sometime ligious background is Hindu.   ADVANCED DIRECTIVES: In place   HEALTH MAINTENANCE: History  Substance Use Topics   Smoking status: Former Smoker -- 0.25 packs/day for 3 years    Types: Cigarettes    Quit date: 07/03/1994   Smokeless  tobacco: Never Used   Alcohol Use: Yes     Comment: socially      Colonoscopy:  PSA: n/a:  Bone density:  Lipid panel:  No Known Allergies  Current Outpatient Prescriptions  Medication Sig Dispense Refill   ibuprofen (ADVIL,MOTRIN) 400 MG tablet Take 400 mg by mouth every 6 (six) hours as needed.       mupirocin ointment (BACTROBAN) 2 % Place 1 application into the nose 2 (two) times daily. Last dose 07/11/2013.  22 g  0   oxyCODONE-acetaminophen (ROXICET) 5-325 MG per tablet Take 1 tablet by mouth every 4 (four) hours as needed for severe pain.  30 tablet  0   traMADol (ULTRAM) 50 MG tablet Take 1-2 tablets (50-100 mg total) by mouth every 6 (six) hours as needed for moderate pain.  30 tablet  0   No current facility-administered medications for this visit.    OBJECTIVE: [name] who moves carefully Filed Vitals:   07/11/13 1646  BP: 111/71  Pulse: 68  Temp: 98.4 F (36.9 C)     There is no weight on file to calculate BMI.    ECOG FS:1 - Symptomatic but completely ambulatory  Ocular: Sclerae unicteric, pupils equal, round;  Lungs no rales or rhonchi, good excursion bilaterally Heart regular rate and rhythm MSK no focal spinal tenderness, no joint edema; the median sternotomy incision is healing nicely, without dehiscence, erythema, or swelling. Neuro: non-focal, well-oriented, appropriate affect Breasts:  Deferred   LAB RESULTS: Results for Mark, [name] (MRN [id number]) as of 07/03/2013 08:54  Ref. Range 07/02/2013 18:25  AFP-Tumor Marker Latest Range: 0.0-8.0 ng/mL <1.3  Results for Mark, [name] [facility] (MRN [id number]) as of 07/03/2013 08:54  Ref. Range 07/02/2013 18:25  hCG, Beta Chain, Quant, S Latest Range: <5 mIU/mL <1   Anti-cholinesterase antibodies negative   CMP     Component Value Date/Time   NA 142 07/08/2013 0520   K 4.8 07/08/2013 0520   CL 106 07/08/2013 0520   CO2 27 07/08/2013 0520   GLUCOSE 121* 07/08/2013 0520   BUN 8  07/08/2013 0520   CREATININE 0.78 07/08/2013 0520   CALCIUM 9.2 07/08/2013 0520   PROT 6.9 07/08/2013 0520   ALBUMIN 3.3* 07/08/2013 0520   AST 28 07/08/2013 0520   ALT 14 07/08/2013 0520   ALKPHOS 69 07/08/2013 0520   BILITOT 0.4 07/08/2013 0520   GFRNONAA >90 07/08/2013 0520   GFRAA >90 07/08/2013 0520    I No results found for this basename: SPEP,  UPEP,   kappa and lambda light chains    Lab Results  Component Value Date   WBC 7.6 07/08/2013   NEUTROABS 5.6 07/02/2013   HGB 12.5* 07/08/2013   HCT 37.6* 07/08/2013   MCV 80.9 07/08/2013   PLT 278 07/08/2013      Chemistry      Component Value Date/Time   NA 142 07/08/2013 0520   K 4.8 07/08/2013 0520   CL 106 07/08/2013 0520   CO2 27 07/08/2013 0520   BUN 8 07/08/2013  0520   CREATININE 0.78 07/08/2013 0520      Component Value Date/Time   CALCIUM 9.2 07/08/2013 0520   ALKPHOS 69 07/08/2013 0520   AST 28 07/08/2013 0520   ALT 14 07/08/2013 0520   BILITOT 0.4 07/08/2013 0520       No results found for this basename: LABCA2    No components found with this basename: LABCA125     Recent Labs Lab 07/05/13 1259  INR 0.91    Urinalysis    Component Value Date/Time   COLORURINE YELLOW 07/05/2013 1255    STUDIES: Patient: JERAMIAH, MCCAUGHEY [facility] Collected: 07/06/2013 Client: [facility] Accession: [id number] Received: 07/06/2013 Sheliah Plane DOB: July 14, 1976 Age: 63 Gender: M Reported: 07/11/2013 [address] Patient Ph: [phone] MRN #: [id number] [place], [place] [address] Visit #: [id number] Chart #: Phone:  Fax: CC: Chipper Herb, MD Ruthann Cancer, MD REPORT OF SURGICAL PATHOLOGY REASON FOR ADDENDUM, AMENDMENT OR CORRECTION: [id number]: Typographical error in synoptic report FINAL DIAGNOSIS Diagnosis 1. Lymph node, biopsy, Mediastinum - ONE LYMPH NODE, NEGATIVE FOR TUMOR (0/1) 2. Mediastinum, mass resection - THYMOMA, WHO TYPE B2, SEE COMMENT. - TUMOR PRESENT AT CAUTERIZED SURGICAL  MARGIN. Microscopic Comment 2. THYMUS: RESECTION Specimen: Mediastinal mass/thymus Procedure: Resection Specimen Integrity: Intact Specimen Weight: 49 grams Tumor Size: 6.7 x 6.0 x 3.0 Histologic Type: Type B2 thymoma Thymic carcinoma: N/A Tumor Extension: Tumor extends to and focally to broadly involves surgical margin Margins: Margins involved by tumor Treatment Effect: N/A Lymph-Vascular Invasion: Not identified Regional Lymph Nodes: No regional lymph node metastasis 1080Number of Lymph Nodes Examined: 1 Number of Lymph Nodes Involved: 0 1530Implants/Distant Metastasis: Cannot be assessed Pathologic Staging for Thymomas (Modified Masaoka Stage): At least Stage IIb see comment Comment: Given that the tumor focally to broadly involves surgical margin with trans-capsular tumor extension (where the capsule is present), the total tumor extension within the mediastinum cannot be determined by histopathology. As such, the stage is consider to be at least Stage IIb. Radiographic and intraoperative correlation required. The case was reviewed with Dr Laureen Ochs who concurs. The case was discussed with Dr Darnelle Catalan on 07/10/2013. (CR:ecj 07/10/2013) 1 of 2 Amended copy Amended FINAL for ANTIONE, OBAR [facility] ([id number]) Microscopic Comment(continued) [place] RUND DO Pathologist, Electronic Signature (Case signed 07/11/2013) Intraoperative Diagnosis 1. RAPID INTRAOPERATIVE CONSULT, LYMPH NODE, MEDIASTINAL, FROZEN SECTION - BENIGN LYMPH NODE. (JBK) 2. RAPID INTRAOPERATIVE CONSULT, MEDIASTINAL MASS, RESECTION, FROZEN SECTION (FSA, FSB) - THYMIC TISSUE, SUGGESTIVE OF THYMOMA. (JBK) Specimen Gross and Clinical Information Specimen(s) Obtained: 1. Lymph node, biopsy, Mediastinum 2. Mediastinum, mass resection Specimen Clinical Information 1. Mediastinal mass (tl) 2. (tl) Gross 1. Received fresh is a 0.4 cm tan pink soft node which is submitted in one block for frozen section. 2. Received fresh  is a 49 gram, 6.7 x 6 x 3 cm firm tissue with focally smooth glistening exterior. The specimen is inked, and the cut surfaces are predominantly tan pink to yellow red, diffusely nodular / lobulated, with focal cystic areas containing dark red fluid, with tan yellow to red friable edges. A portion of tissue is saved for possible flow cytometry, and representative sections are submitted in two blocks, A and B for frozen section. Additional sections for routine histology are submitted in blocks C - F. Total six blocks. Additional sections are submitted in blocks G-J. (SW:caf 11/10/ ASSESSMENT: 37 y.o.   man presenting with shortness of breath, CT scan showing an anterior mediastinal mass, with normal beta hCG and alpha-fetoprotein  (1) status  post median sternotomy November 7 for a pT2 pN0, stage II thymoma, grade 2b, with positive margins  PLAN: As discussed above, several questions remain to be clarified from the pathology report, which I have discussed personally with Dr. Frederica Kuster and Dr. Laureen Ochs. It may be worthwhile to seek a second pathologic opinion, given the rarity of these tumors, and Dr. Mahala Menghini is very much in favor of that. His case is going to be presented tomorrow at the thoracic conference, to see if there is a consensus whether or not he will need adjuvant radiation therapy.  To review: NCCN guidelines suggest consideration of XRT for stage II thymomas, but  My understanding is that for stage II thymomas radiation should be considered after resection with no residual tumor, but recommend XRT if microscopic residual tumor is present--specifically a dose of 54 gray is recommended for microscopically positive resection margins.  I have not made a return appointment for [facility] at this time, pending whether or not we will need further pathologic review. In any case she will need followup, and the recommendations are being for CT scans every 6 months for 2 years, then annually for 10 years. It  may be worthwhile to explore whether MRI might be a feasible alternative, to avoid additional radiation exposure.     Marland Kitchen Lowella Dell, MD   07/11/2013 7:13 PM

## 2013-07-12 ENCOUNTER — Other Ambulatory Visit: Payer: Self-pay | Admitting: Oncology

## 2013-07-13 ENCOUNTER — Telehealth: Payer: Self-pay | Admitting: Oncology

## 2013-07-13 ENCOUNTER — Telehealth: Payer: Self-pay | Admitting: *Deleted

## 2013-07-13 ENCOUNTER — Other Ambulatory Visit: Payer: Self-pay | Admitting: Oncology

## 2013-07-13 DIAGNOSIS — D4989 Neoplasm of unspecified behavior of other specified sites: Secondary | ICD-10-CM

## 2013-07-13 NOTE — Telephone Encounter (Signed)
Called patient to inform of appt. For 08-02-13 - arrival time - 12:30 pm , spoke with patient and he is aware of this appt. Change and is good with the day and the time

## 2013-07-13 NOTE — Telephone Encounter (Signed)
CALLED PATIENT TO INFORM OF  VISIT FOR 08-02-13- ARRIVALTIME - 9:30 AM , APPT. WITH DR. Dayton Scrape, LVM FOR A RETURN CALL

## 2013-07-13 NOTE — Telephone Encounter (Signed)
S/w pt re appt for 12/3. Sent message Clydie Braun in radonc re getting pt in sooner than 12/4 to see Dr. Dayton Scrape. Pt was scheduled for 12/4 already but GM requested per 11/14 pof that pt we seen within 2wks. Pt aware.

## 2013-07-16 ENCOUNTER — Ambulatory Visit
Admission: RE | Admit: 2013-07-16 | Discharge: 2013-07-16 | Disposition: A | Discharge status: Home / Self Care | Payer: PRIVATE HEALTH INSURANCE | Source: Ambulatory Visit | Attending: Cardiothoracic Surgery | Admitting: Cardiothoracic Surgery

## 2013-07-16 ENCOUNTER — Other Ambulatory Visit: Payer: Self-pay | Admitting: *Deleted

## 2013-07-16 ENCOUNTER — Telehealth: Payer: Self-pay | Admitting: *Deleted

## 2013-07-16 ENCOUNTER — Telehealth: Payer: Self-pay | Admitting: Oncology

## 2013-07-16 ENCOUNTER — Ambulatory Visit (INDEPENDENT_AMBULATORY_CARE_PROVIDER_SITE_OTHER): Payer: PRIVATE HEALTH INSURANCE | Admitting: Physician Assistant

## 2013-07-16 VITALS — BP 124/69 | HR 72 | Resp 20 | Ht 66.0 in | Wt 160.0 lb

## 2013-07-16 DIAGNOSIS — J9859 Other diseases of mediastinum, not elsewhere classified: Secondary | ICD-10-CM

## 2013-07-16 DIAGNOSIS — R222 Localized swelling, mass and lump, trunk: Secondary | ICD-10-CM

## 2013-07-16 DIAGNOSIS — D150 Benign neoplasm of thymus: Secondary | ICD-10-CM

## 2013-07-16 DIAGNOSIS — D4989 Neoplasm of unspecified behavior of other specified sites: Secondary | ICD-10-CM

## 2013-07-16 DIAGNOSIS — Z09 Encounter for follow-up examination after completed treatment for conditions other than malignant neoplasm: Secondary | ICD-10-CM

## 2013-07-16 NOTE — Telephone Encounter (Signed)
per staff message response from Clydie Braun in radonc appt w/Dr. Dayton Scrape has been moved to 11/20 and she is waiting for pt to call back to confirm,. I also lmonvm for pt re change in appt d/t for Dr. Dayton Scrape to 11/20 @ 9:30am. pt was expecting  my call as I spoke with him on Friday 11/14 re trying to get a sooner appt w/Dr. Dayton Scrape

## 2013-07-16 NOTE — Progress Notes (Addendum)
[address]       [place] [address]             [phone]                  [name] Medical Record #[id number] Date of Birth: Sep 02, 1975  Magrinat, Mark Hue, MD [name], MD  Chief Complaint:   PostOp Follow Up Visit   History of Present Illness:     This is a 37 year old male who is s/p bronchoscopy and partial sternotomy with resection of anterior mediastinal tumor by Dr. Tyrone Sage on 07/06/2013. His only complaint is some incisional pain. He denies any shortness of breath, chest pain, fever, or chills.   History  Smoking status  . Former Smoker -- 0.25 packs/day for 3 years  . Types: Cigarettes  . Quit date: 07/03/1994  Smokeless tobacco  . Never Used       No Known Allergies  Current Outpatient Prescriptions  Medication Sig Dispense Refill  . ibuprofen (ADVIL,MOTRIN) 400 MG tablet Take 400 mg by mouth every 6 (six) hours as needed.       No current facility-administered medications for this visit.     Physical Exam: BP 124/69  Pulse 72  Resp 20  Ht 5\' 6"  (1.676 m)  Wt 72.576 kg (160 lb)  BMI 25.84 kg/m2  SpO2 98%  General appearance: alert, cooperative and no distress Heart: regular rate and rhythm, S1, S2 normal, no murmur, click, rub or gallop Lungs: clear to auscultation bilaterally Abdomen: soft, non-tender; bowel sounds normal; no masses,  no organomegaly Wound: Clean and dry. No signs of infection. Two silk sutures removed at previous chest tube sites.  Diagnostic Studies & Laboratory data:         Recent Radiology Findings: Dg Chest 2 View  07/16/2013   CLINICAL DATA:  Chest pain; status post thymectomy  EXAM: CHEST  2 VIEW  COMPARISON:  July 08, 2013  FINDINGS: Patient is status post median sternotomy. There is no evidence of anterior mediastinal fullness.  Lungs are clear. Heart size and pulmonary vascularity are normal. No pneumothorax or pneumomediastinum. No adenopathy. There is mild  degenerative change in the thoracic spine.  IMPRESSION: No edema or consolidation. No evidence of anterior mediastinal fullness. No evidence of pneumothorax or pneumomediastinum.   Electronically Signed   By: Bretta Bang M.D.   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 He did drive himself to this appointment and did have some incisonal tenderness.  He would like to return to work and he was instructed he may do so. He still must continue with sternal precautions (no lifting more than a few pounds for several more weeks).He is very active and inquired on when he would be able to return to the gym to "work out". He was instructed to not lift any weights for at least 6-8  weeks. No push ups for at least 3 months. He may use the treadmill. He will return to see Dr. Tyrone Sage for a follow up appointment in 6 weeks with a chest x ray.    Ardelle Balls PA-C 07/16/2013 3:25 PM

## 2013-07-16 NOTE — Telephone Encounter (Signed)
Called this patient and informed him that Dr. Dayton Scrape would like to see [name] if the pathology in not back, spoke with patient and he is aware of this and he will see him on Thursday Nov. 20

## 2013-07-18 ENCOUNTER — Encounter: Payer: Self-pay | Admitting: Radiation Oncology

## 2013-07-18 NOTE — Progress Notes (Signed)
Thoracic Location of Tumor / Histology: mediastinum  Patient presented 1/2 months ago with symptoms of: SOB, low grade fever, sensation he could not take deep breath  Biopsies of mediastinum (if applicable) revealed:  07/06/13  Diagnosis 1. Lymph node, biopsy, Mediastinum - ONE LYMPH NODE, NEGATIVE FOR TUMOR (0/1) 2. Mediastinum, mass resection - THYMOMA, WHO TYPE B2, SEE COMMENT. - TUMOR PRESENT AT CAUTERIZED SURGICAL MARGIN.  Tobacco/Marijuana/Snuff/ETOH use: former smoker, quit 1995, .25 PPD x 3 years  Past/Anticipated interventions by cardiothoracic surgery, if any: biopsy, mediastinum mass resection on 07/06/13  Past/Anticipated interventions by medical oncology, if any:  Dr Darnelle Catalan:  NCCN guidelines suggest consideration of XRT for stage II thymomas, but My understanding is that for stage II thymomas radiation should be considered after resection with no residual tumor, but recommend XRT if microscopic residual tumor is present--specifically a dose of 54 gray is recommended for microscopically positive resection margins. I have not made a return appointment for [facility] at this time, pending whether or not we will need further pathologic review. In any case she will need followup, and the recommendations are being for CT scans every 6 months for 2 years, then annually for 10 years. It may be worthwhile to explore whether MRI might be a feasible alternative, to avoid additional radiation exposure. Pt has FU appointment w/Dr Magrinat on 08/01/13.  Signs/Symptoms  Weight changes, if any: no  Respiratory complaints, if any: no  Hemoptysis, if any: no  Pain issues, if any:  Post op upper chest discomfort 3/10 on pain scale relieved w/Ibuprofen this morning. Tylenol prn.  SAFETY ISSUES:  Prior radiation? no  Pacemaker/ICD? no  Possible current pregnancy? na  Is the patient on methotrexate? 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 Pt denies pain at this time, denies fatigue, SOB, cough. He states he wakes w/slight sore throat since surgery.

## 2013-07-19 ENCOUNTER — Encounter: Payer: Self-pay | Admitting: Radiation Oncology

## 2013-07-19 ENCOUNTER — Ambulatory Visit
Admission: RE | Admit: 2013-07-19 | Discharge: 2013-07-19 | Disposition: A | Discharge status: Home / Self Care | Payer: PRIVATE HEALTH INSURANCE | Source: Ambulatory Visit | Attending: Radiation Oncology | Admitting: Radiation Oncology

## 2013-07-19 VITALS — BP 112/72 | HR 51 | Temp 97.7°F | Resp 20 | Wt 161.0 lb

## 2013-07-19 DIAGNOSIS — D4989 Neoplasm of unspecified behavior of other specified sites: Secondary | ICD-10-CM

## 2013-07-19 DIAGNOSIS — D150 Benign neoplasm of thymus: Secondary | ICD-10-CM | POA: Insufficient documentation

## 2013-07-19 HISTORY — DX: Benign neoplasm of thymus: D15.0

## 2013-07-19 NOTE — Progress Notes (Signed)
[facility] Radiation Oncology NEW PATIENT EVALUATION  Name: [name] MRN: [id number]  Date:   07/19/2013           DOB: July 24, 1976  Status: outpatient   CC: [name], MD  Magrinat, Valentino Hue, MD , Dr. 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 male who is seen today for the courtesy of Dr. Darnelle Catalan for evaluation of his thymoma. He presented with a recent history of chest discomfort and dyspnea. In the ER a D-dimer was slightly elevated leading to a CT angiogram of the chest on 07/02/2013. There was no evidence of pulmonary embolism. He was found to have an anterior superior mediastinal mass measuring 3.7 x 5.9 x 4.8 cm. There is no adenopathy or evidence for pleural or pericardial effusions. The patient was seen by Dr. Tyrone Sage who performed a partial sternotomy with resection of what was felt represents a thymoma. Dr. Tyrone Sage noted that there was adherence of tumor along the right pleural surface, but this was completely excised. He felt that he had a complete resection. There is no evidence for invasion into the pericardium, phrenic nerve, or vascular structures. A less than 1 cm lymph node was identified and felt to represent benign lymphoid tissue. He did well postoperatively. On review of his pathology, which was read by Dr. Frederica Kuster, he was found to have a type B2 thymoma with tumor extending to and focally to broadly involving a surgical margin. The tumor measures 6.7 x 6.0 x 3.0 cm. There was felt to be transcapsular  tumor extension (where the capsule was present). Dr. Tyrone Sage and I spoke with Dr. Frederica Kuster , and we could not reconcile his report with the intraoperative findings. Pathology was sent for outside review. He was presented last week at the thoracic oncology conference, and again Dr. Tyrone Sage felt that he had a complete  resection.  PREVIOUS RADIATION THERAPY: No   PAST MEDICAL HISTORY:  has a past medical history of Laryngomalacia, congenital; Dermoid cyst of eyelid; Shortness of breath; Bronchitis (06/30/13); and Thymoma (07/06/13).     PAST SURGICAL HISTORY:  Past Surgical History  Procedure Laterality Date  . Tracheotomy       6 months old for 3 months  . Minor hemorrhoidectomy    . Video bronchoscopy N/A 07/06/2013    Procedure: VIDEO BRONCHOSCOPY;  Surgeon: [name], MD;  Location: [facility] OR;  Service: Thoracic;  Laterality: N/A;  . Mediasternotomy N/A 07/06/2013    Procedure: PARTIAL MEDIASTERNOTOMY//RESECTION OF ANTERIOR MEDIASTINAL MASS;  Surgeon: [name], MD;  Location: [facility] OR;  Service: Thoracic;  Laterality: N/A;  RESECTION OF ANTERIOR MEDIASTINAL MASS     FAMILY HISTORY: family history includes Cancer in his paternal grandmother; Cancer (age of onset: 65) in his paternal grandfather. His father is 45 with multiple medical problems including coronary artery disease. His mother is 45. His paternal grandfather had prostate cancer.   SOCIAL HISTORY:  reports that he quit smoking about 19 years ago. His smoking use included Cigarettes. He has a .75 pack-year smoking history. He has never used smokeless tobacco. He reports that he drinks alcohol. He reports that he does not use illicit drugs. Married with a 73-year-old son. 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 He is a native of [place].   ALLERGIES: Review of patient's allergies indicates  no known allergies.   MEDICATIONS:  Current Outpatient Prescriptions  Medication Sig Dispense Refill  . acetaminophen (TYLENOL) 325 MG tablet Take 650 mg by mouth every 6 (six) hours as needed.      Marland Kitchen ibuprofen (ADVIL,MOTRIN) 400 MG tablet Take 400 mg by mouth every 6 (six) hours as needed.       No current facility-administered medications for this encounter.     REVIEW OF SYSTEMS:  Pertinent items are noted in HPI.    PHYSICAL  EXAM:  weight is 161 lb (73.029 kg). His temperature is 97.7 F (36.5 C). His blood pressure is 112/72 and his pulse is 51. His respiration is 20.   Alert and oriented. And neck examination: Grossly unremarkable. Nodes: Without palpable cervical or supraclavicular lymphadenopathy. Chest: Partial sternotomy wound is well-healed. Lungs are clear. Heart regular in rhythm. Extremities: Without edema.   LABORATORY DATA:  Lab Results  Component Value Date   WBC 7.6 07/08/2013   HGB 12.5* 07/08/2013   HCT 37.6* 07/08/2013   MCV 80.9 07/08/2013   PLT 278 07/08/2013   Lab Results  Component Value Date   NA 142 07/08/2013   K 4.8 07/08/2013   CL 106 07/08/2013   CO2 27 07/08/2013   Lab Results  Component Value Date   ALT 14 07/08/2013   AST 28 07/08/2013   ALKPHOS 69 07/08/2013   BILITOT 0.4 07/08/2013      IMPRESSION: Stage IIB (T2 N0) thymoma, histologic type B2. On review of his pathology, there is transcapsular tumor extension with an involved surgical margin. This margin is probably along the right pleural surface. Dr. Tyrone Sage tried to review the gross pathology with Dr. Frederica Kuster, but it is now difficult to assess whether not the patient truly had a positive margin. Histologically, we rely quite heavily on the surgeon's interpretation as to the local aggressiveness of a thymoma. If the tumor is felt to be encapsulated and completely resected, we do not offer her adjuvant radiation therapy. I explained to the patient and his wife that we have a dilemma in that the patient's pathology does not correlate with Dr. Dennie Maizes intraoperative findings. Furthermore, based on the NCCN guidelines the role of radiation therapy even for a positive margin is 2B (based on low-level evidence by consensus that intervention is appropriate). I do not recommend postoperative radiation therapy based on the operative findings. He can be followed by CT or MR at regular intervals. I defer to Dr. Tyrone Sage as to the frequency of  surveillance scanning. The patient was informed that in the unlikely event that he develops a local recurrence, we would typically favor surgical resection and then radiation therapy at that time. Dose delivered for potential microscopic disease is less than the dose delivered for gross disease. The patient and his wife are comfortable with this approach.   PLAN: As discussed above.   I spent 40 minutes minutes face to face with the patient and more than 50% of that time was spent in counseling and/or coordination of care.

## 2013-07-19 NOTE — Progress Notes (Signed)
Please see the Nurse Progress Note in the MD Initial Consult Encounter for this patient. 

## 2013-07-20 ENCOUNTER — Other Ambulatory Visit: Payer: Self-pay | Admitting: Oncology

## 2013-07-23 ENCOUNTER — Encounter: Payer: Self-pay | Admitting: Radiation Oncology

## 2013-07-23 NOTE — Progress Notes (Signed)
Chart note: The outside second opinion pathology report from [facility] is in agreement with the observations made by Dr. Frederica Kuster. In addition,there appears to be a minor  B3 component indicating perhaps a more aggressive thymoma. Dr. Tyrone Sage operative report photograph does show attached pleura. Dr. Tyrone Sage does not feel that the ink seen on the cauterized margin represents the true margin of the thymoma. Again, Dr. Tyrone Sage feels that he had a clean resection, and thus there would be no significant benefit for postoperative radiation therapy. Dr. Tyrone Sage and I reviewed and discussed this report in detail.

## 2013-08-01 ENCOUNTER — Ambulatory Visit: Payer: PRIVATE HEALTH INSURANCE | Admitting: Oncology

## 2013-08-02 ENCOUNTER — Ambulatory Visit: Payer: PRIVATE HEALTH INSURANCE | Admitting: Radiation Oncology

## 2013-08-02 ENCOUNTER — Other Ambulatory Visit: Payer: Self-pay | Admitting: Oncology

## 2013-08-02 ENCOUNTER — Ambulatory Visit: Payer: PRIVATE HEALTH INSURANCE

## 2013-08-02 ENCOUNTER — Ambulatory Visit: Payer: PRIVATE HEALTH INSURANCE | Admitting: Cardiothoracic Surgery

## 2013-08-02 DIAGNOSIS — D4989 Neoplasm of unspecified behavior of other specified sites: Secondary | ICD-10-CM

## 2013-08-03 ENCOUNTER — Telehealth: Payer: Self-pay | Admitting: Oncology

## 2013-08-03 NOTE — Telephone Encounter (Signed)
, °

## 2013-08-04 ENCOUNTER — Other Ambulatory Visit: Payer: Self-pay | Admitting: Oncology

## 2013-08-14 ENCOUNTER — Other Ambulatory Visit: Payer: Self-pay | Admitting: Oncology

## 2013-08-16 ENCOUNTER — Telehealth: Payer: Self-pay | Admitting: Oncology

## 2013-08-16 NOTE — Telephone Encounter (Signed)
[facility] WILL CONTACT PT RE SCAN APPTS. NO OTHER ORDERS PER 12/16 POF.

## 2013-08-17 ENCOUNTER — Other Ambulatory Visit: Payer: Self-pay | Admitting: *Deleted

## 2013-08-17 DIAGNOSIS — J9859 Other diseases of mediastinum, not elsewhere classified: Secondary | ICD-10-CM

## 2013-08-21 ENCOUNTER — Other Ambulatory Visit: Payer: Self-pay | Admitting: Oncology

## 2013-08-22 ENCOUNTER — Other Ambulatory Visit: Payer: Self-pay | Admitting: Oncology

## 2013-08-22 ENCOUNTER — Ambulatory Visit (HOSPITAL_COMMUNITY)
Admission: RE | Admit: 2013-08-22 | Discharge: 2013-08-22 | Disposition: A | Discharge status: Home / Self Care | Payer: PRIVATE HEALTH INSURANCE | Source: Ambulatory Visit | Attending: Oncology | Admitting: Oncology

## 2013-08-22 ENCOUNTER — Ambulatory Visit: Payer: PRIVATE HEALTH INSURANCE | Admitting: Cardiothoracic Surgery

## 2013-08-22 DIAGNOSIS — Z09 Encounter for follow-up examination after completed treatment for conditions other than malignant neoplasm: Secondary | ICD-10-CM | POA: Insufficient documentation

## 2013-08-22 DIAGNOSIS — Z85858 Personal history of malignant neoplasm of other endocrine glands: Secondary | ICD-10-CM | POA: Insufficient documentation

## 2013-08-22 DIAGNOSIS — D4989 Neoplasm of unspecified behavior of other specified sites: Secondary | ICD-10-CM

## 2013-08-22 MED ORDER — GADOBENATE DIMEGLUMINE 529 MG/ML IV SOLN
15.0000 mL | Freq: Once | INTRAVENOUS | Status: AC | PRN
  Administered 2013-08-22: 15 mL via INTRAVENOUS

## 2013-08-27 ENCOUNTER — Ambulatory Visit (HOSPITAL_COMMUNITY): Payer: PRIVATE HEALTH INSURANCE

## 2013-09-07 ENCOUNTER — Other Ambulatory Visit: Payer: Self-pay | Admitting: Oncology

## 2013-09-07 ENCOUNTER — Ambulatory Visit: Payer: PRIVATE HEALTH INSURANCE | Admitting: Oncology

## 2013-09-07 DIAGNOSIS — D150 Benign neoplasm of thymus: Principal | ICD-10-CM

## 2013-09-07 DIAGNOSIS — D4989 Neoplasm of unspecified behavior of other specified sites: Secondary | ICD-10-CM

## 2013-09-10 ENCOUNTER — Telehealth: Payer: Self-pay | Admitting: Oncology

## 2013-09-10 NOTE — Telephone Encounter (Signed)
lmonvm for pt re appt for 4/20 and mailed schedule. central will call pt re mri appt.

## 2013-09-13 ENCOUNTER — Other Ambulatory Visit: Payer: Self-pay | Admitting: Oncology

## 2013-10-01 ENCOUNTER — Telehealth: Payer: Self-pay | Admitting: Oncology

## 2013-10-01 NOTE — Telephone Encounter (Signed)
pt called and needed to r/s MD visit...done...pt ok and aware

## 2013-11-12 ENCOUNTER — Telehealth: Payer: Self-pay | Admitting: Internal Medicine

## 2013-11-12 DIAGNOSIS — J209 Acute bronchitis, unspecified: Secondary | ICD-10-CM

## 2013-11-12 MED ORDER — DOXYCYCLINE HYCLATE 100 MG PO TABS: 100.0000 mg | ORAL_TABLET | Freq: Two times a day (BID) | ORAL | Status: DC

## 2013-11-12 NOTE — Telephone Encounter (Signed)
Patient having cough,co cold , congeestion since Thursday.  Dx  Acute bronchitis  P Take doxycycline 100mg  po twice daily x 5 days; take after meals and avoid sunlight Further fu with PCP or DR Magrinat both of whom he could not reach 11/12/2013   Dr. Brand Males, M.D., [facility].C.P Pulmonary and Critical Care Medicine Staff Physician [place] [facility] Pager: [phone], If no answer or between  15:00h - 7:00h: call [phone]  11/12/2013 6:38 PM

## 2013-11-13 ENCOUNTER — Telehealth: Payer: Self-pay | Admitting: Internal Medicine

## 2013-11-13 DIAGNOSIS — J209 Acute bronchitis, unspecified: Secondary | ICD-10-CM

## 2013-11-13 MED ORDER — DOXYCYCLINE HYCLATE 100 MG PO TABS: 100.0000 mg | ORAL_TABLET | Freq: Two times a day (BID) | ORAL | Status: AC

## 2013-11-13 NOTE — Telephone Encounter (Signed)
Can you plese call Take doxycycline 100mg  po twice daily x 5 days; take after meals and avoid sunlight with 2 refills to [facility] [address]. I left message but he still has not gotten it. Also, he needs e prescribe set with them  Thanks  Dr. Brand Males, M.D., [facility].C.P Pulmonary and Critical Care Medicine Staff Physician [place] [facility] Pager: [phone], If no answer or between  15:00h - 7:00h: call [phone]  11/13/2013 10:13 AM

## 2013-11-13 NOTE — Telephone Encounter (Signed)
rx sent to [facility]. Biehle Bing, CMA

## 2013-12-06 ENCOUNTER — Ambulatory Visit (HOSPITAL_COMMUNITY): Payer: PRIVATE HEALTH INSURANCE

## 2013-12-10 ENCOUNTER — Ambulatory Visit (HOSPITAL_COMMUNITY)
Admission: RE | Admit: 2013-12-10 | Discharge: 2013-12-10 | Disposition: A | Discharge status: Home / Self Care | Payer: 59 | Source: Ambulatory Visit | Attending: Oncology | Admitting: Oncology

## 2013-12-10 ENCOUNTER — Telehealth: Payer: Self-pay | Admitting: Oncology

## 2013-12-10 ENCOUNTER — Ambulatory Visit: Payer: 59 | Admitting: Oncology

## 2013-12-10 VITALS — BP 123/66 | HR 55 | Temp 97.7°F | Resp 18 | Ht 66.0 in | Wt 165.1 lb

## 2013-12-10 DIAGNOSIS — D4989 Neoplasm of unspecified behavior of other specified sites: Secondary | ICD-10-CM

## 2013-12-10 DIAGNOSIS — D150 Benign neoplasm of thymus: Secondary | ICD-10-CM

## 2013-12-10 DIAGNOSIS — R222 Localized swelling, mass and lump, trunk: Secondary | ICD-10-CM | POA: Insufficient documentation

## 2013-12-10 MED ORDER — GADOBENATE DIMEGLUMINE 529 MG/ML IV SOLN
15.0000 mL | Freq: Once | INTRAVENOUS | Status: AC | PRN
  Administered 2013-12-10: 14 mL via INTRAVENOUS

## 2013-12-10 NOTE — Telephone Encounter (Signed)
, °

## 2013-12-10 NOTE — Progress Notes (Signed)
ID: [name] OB: [birth year]  MR#: [id number]  CSN#:[id number]  PCP: Jani Gravel, MD GYN:   SU: Lanelle Bal MD OTHER MD: Arloa Koh MD  CHIEF COMPLAINT: History of thymoma  HISTORY OF PRESENT ILLNESS: [name] started feeling a little tired and short of breath about 3 days ago (end of October 20 14th). On November 1 she had myalgias and arthralgias and a temperature up to 99.0. He was feeling like a neck pain bilaterally, TMJ-like symptoms, but the main problem was that he couldn't take a deep breath. There was no pleurisy. There was no cough, phlegm production, or hemoptysis.   As the symptoms did not resolve he presented to the emergency room where a d-dimer was obtained. As this was elevated, a CT angiogram followed. 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 This was clearly separate from the thyroid gland. There was no obvious adenopathy.   With this information we were contacted. We suggested the obtaining of additional lab work, to include an alpha-fetoprotein and beta hCG. [name]'s subsequent history is as detailed below.  INTERVAL HISTORY: [name] returns today for followup of thymoma. Interval history is generally unremarkable. He is working full-time, spending time with his family, and exercising at the gym and doing martial arts.  REVIEW OF SYSTEMS: [name] has no chest pain in the area of the prior surgery. There is no pleurisy or shortness of breath. There have been no fevers, rash, bleeding, or other systemic symptoms except he was feeling very fatigued approximately 2 months ago, likely because of his extreme exercise and diet routines. As soon as he slackened up on those he felt "back to normal". A detailed review of systems today was otherwise noncontributory  PAST MEDICAL HISTORY: Past Medical History  Diagnosis Date  . Laryngomalacia, congenital     resolved  . Dermoid cyst of eyelid     removed from  left upper canthus area  . Shortness of breath   . Bronchitis 06/30/13  . Thymoma 07/06/13    WHO type B2    PAST SURGICAL HISTORY: Past Surgical History  Procedure Laterality Date  . Tracheotomy       6 months old for 3 months  . Minor hemorrhoidectomy    . Video bronchoscopy N/A 07/06/2013    Procedure: VIDEO BRONCHOSCOPY;  Surgeon: Grace Isaac, MD;  Location: [facility];  Service: Thoracic;  Laterality: N/A;  . Mediasternotomy N/A 07/06/2013    Procedure: PARTIAL MEDIASTERNOTOMY//RESECTION OF ANTERIOR MEDIASTINAL MASS;  Surgeon: Grace Isaac, MD;  Location: [facility];  Service: Thoracic;  Laterality: N/A;  RESECTION OF ANTERIOR MEDIASTINAL MASS    FAMILY HISTORY Family History  Problem Relation Age of Onset  . Cancer Paternal Grandmother     intra-abdominal malignancy  . Cancer Paternal Grandfather 41    prostate   The patient's parents are living, in their mid 29s. The patient has one brother, no sisters. There is no history of cancer in the immediate family. The patient's father's father was diagnosed with prostate cancer in his 38s. The patient's father's mother was diagnosed with some intra-abdominal malignancy.  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 His wife Tiburcio Bash is a Community education officer at [employer]. They have one son, Lethea Killings, currently 39 years old. They tell me they do not intend to have any more children. The patient's cultur/re yearal sometime ligious background is Hindu.   ADVANCED DIRECTIVES: In place  HEALTH MAINTENANCE: History  Substance Use Topics  . Smoking status: Former Smoker -- 0.25 packs/day for 3 years    Types: Cigarettes    Quit date: 07/03/1994  . Smokeless tobacco: Never Used  . Alcohol Use: Yes     Comment: socially      Colonoscopy:  PSA: n/a:  Bone density:  Lipid panel:  No Known Allergies  Current Outpatient Prescriptions  Medication Sig Dispense Refill  . acetaminophen (TYLENOL) 325 MG tablet Take 650 mg by mouth  every 6 (six) hours as needed.      Marland Kitchen ibuprofen (ADVIL,MOTRIN) 400 MG tablet Take 400 mg by mouth every 6 (six) hours as needed.       No current facility-administered medications for this visit.    OBJECTIVE: Young-appearing white man in no acute distress Filed Vitals:   12/10/13 1158  BP: 123/66  Pulse: 55  Temp: 97.7 F (36.5 C)  Resp: 18     Body mass index is 26.66 kg/(m^2).    ECOG FS:0 - Asymptomatic  Ocular: Sclerae unicteric Lungs no rales or rhonchi, good excursion bilaterally Heart regular rate and rhythm, no murmur appreciated MSK no focal spinal tenderness, no joint edema; the median sternotomy is pink, nontender, unremarkable  Neuro: non-focal, well-oriented, appropriate affect Breasts:  Deferred; mild gynecomastia noted on prior exams   LAB RESULTS: Results for HALDEN, PHEGLEY (MRN [id number]) as of 07/03/2013 08:54  Ref. Range 07/02/2013 18:25  AFP-Tumor Marker Latest Range: 0.0-8.0 ng/mL <1.3  Results for SAYLOR, SHECKLER [facility] (MRN [id number]) as of 07/03/2013 08:54  Ref. Range 07/02/2013 18:25  hCG, Beta Chain, Quant, S Latest Range: <5 mIU/mL <1   Anti-cholinesterase antibodies negative   CMP     Component Value Date/Time   NA 142 07/08/2013 0520   K 4.8 07/08/2013 0520   CL 106 07/08/2013 0520   CO2 27 07/08/2013 0520   GLUCOSE 121* 07/08/2013 0520   BUN 8 07/08/2013 0520   CREATININE 0.78 07/08/2013 0520   CALCIUM 9.2 07/08/2013 0520   PROT 6.9 07/08/2013 0520   ALBUMIN 3.3* 07/08/2013 0520   AST 28 07/08/2013 0520   ALT 14 07/08/2013 0520   ALKPHOS 69 07/08/2013 0520   BILITOT 0.4 07/08/2013 0520   GFRNONAA >90 07/08/2013 0520   GFRAA >90 07/08/2013 0520    I No results found for this basename: SPEP,  UPEP,   kappa and lambda light chains    Lab Results  Component Value Date   WBC 7.6 07/08/2013   NEUTROABS 5.6 07/02/2013   HGB 12.5* 07/08/2013   HCT 37.6* 07/08/2013   MCV 80.9 07/08/2013   PLT 278 07/08/2013      Chemistry      Component Value  Date/Time   NA 142 07/08/2013 0520   K 4.8 07/08/2013 0520   CL 106 07/08/2013 0520   CO2 27 07/08/2013 0520   BUN 8 07/08/2013 0520   CREATININE 0.78 07/08/2013 0520      Component Value Date/Time   CALCIUM 9.2 07/08/2013 0520   ALKPHOS 69 07/08/2013 0520   AST 28 07/08/2013 0520   ALT 14 07/08/2013 0520   BILITOT 0.4 07/08/2013 0520       No results found for this basename: LABCA2    No components found with this basename: LABCA125    No results found for this basename: INR,  in the last 168 hours  Urinalysis    Component Value Date/Time   COLORURINE YELLOW 07/05/2013 1255    STUDIES:  Mr Chest W Wo Contrast  12/10/2013   CLINICAL DATA:  Follow-up thymoma status post resection  EXAM: MRI CHEST WITHOUT AND WITH CONTRAST  TECHNIQUE: Multiplanar multisequence MR imaging of the chest was performed both before and after administration of intravenous contrast.  CONTRAST:  54mL MULTIHANCE GADOBENATE DIMEGLUMINE 529 MG/ML IV SOLN  COMPARISON:  08/22/2013  FINDINGS: Postsurgical changes related to prior thymoma resection, including susceptibility artifact from prior median sternotomy.  No evidence of anterior mediastinal mass. No enhancement following contrast administration.  Visualized thyroid is unremarkable.  The heart is normal in size.  No pericardial effusion.  Visualized lungs are clear.  No suspicious pulmonary nodules.  No suspicious mediastinal, hilar, or axillary lymphadenopathy.  Mild gynecomastia.  Visualized osseous structures are within normal limits.  IMPRESSION: Postsurgical changes related to prior thymoma resection.  No evidence of recurrent or metastatic disease in the chest.   Electronically Signed   By: Julian Hy M.D.   On: 12/10/2013 11:19   ASSESSMENT: 38 y.o.  [place] man presenting with shortness of breath, CT scan showing an anterior mediastinal mass, with normal beta hCG and alpha-fetoprotein  (1) status post median sternotomy November 7 for a pT2 pN0, stage  II thymoma, grade 2b, with positive margins; decided against adjuvant radiation  PLAN: [name] is doing fine as far as his thymoma is concerned. We are going to repeat an MRI of the chest in October of this year and then yearly for the next 10 years. He is doing well clinically, and is considering starting a martial arts support group at the [facility].  He knows to call for any problems that may develop before his next visit here.    Chauncey Cruel, MD   12/10/2013 12:12 PM

## 2013-12-17 ENCOUNTER — Ambulatory Visit: Payer: PRIVATE HEALTH INSURANCE | Admitting: Oncology

## 2014-01-14 ENCOUNTER — Ambulatory Visit (INDEPENDENT_AMBULATORY_CARE_PROVIDER_SITE_OTHER): Payer: 59 | Admitting: Internal Medicine

## 2014-01-14 DIAGNOSIS — Z23 Encounter for immunization: Secondary | ICD-10-CM

## 2014-01-14 NOTE — Progress Notes (Signed)
   Subjective:    Patient ID: [name], male    DOB: 07/23/1976, 38 y.o.   MRN: [id number]  HPI 38 yo M in good state of health except recent surgery for thymoma in 2014, going to [place], [place] for 10 days leaving may 21 thru 31st with his wife to visit family and friends.  Has had previous hep a, hep b and recent flu vaccination  Previous travel inc. [place], [place], [place], [place], [place]  No Known Allergies Current Outpatient Prescriptions on File Prior to Visit  Medication Sig Dispense Refill  . acetaminophen (TYLENOL) 325 MG tablet Take 650 mg by mouth every 6 (six) hours as needed.      Marland Kitchen ibuprofen (ADVIL,MOTRIN) 400 MG tablet Take 400 mg by mouth every 6 (six) hours as needed.       No current facility-administered medications on file prior to visit.   Active Ambulatory Problems    Diagnosis Date Noted  . Rotator cuff disorder 02/22/2012  . Shortness of breath 07/03/2013  . Thymoma 07/06/2013   Resolved Ambulatory Problems    Diagnosis Date Noted  . Swelling, mass, or lump in chest 07/03/2013   Past Medical History  Diagnosis Date  . Laryngomalacia, congenital   . Dermoid cyst of eyelid   . Bronchitis 06/30/13       Review of Systems     Objective:   Physical Exam        Assessment & Plan:  Pre-travel counseling = gave recs on avoiding risks for traveler's diarrhea, mosquito borne illnesses. Will give typhoid vaccine  Traveler's diarrhea =gave rx to his wife for cipro

## 2014-02-04 ENCOUNTER — Encounter: Payer: Self-pay | Admitting: Family Medicine

## 2014-02-04 ENCOUNTER — Ambulatory Visit (INDEPENDENT_AMBULATORY_CARE_PROVIDER_SITE_OTHER): Payer: 59 | Admitting: Family Medicine

## 2014-02-04 ENCOUNTER — Encounter (INDEPENDENT_AMBULATORY_CARE_PROVIDER_SITE_OTHER): Payer: Self-pay

## 2014-02-04 VITALS — BP 101/65 | HR 57 | Ht 66.0 in | Wt 160.0 lb

## 2014-02-04 DIAGNOSIS — M25511 Pain in right shoulder: Secondary | ICD-10-CM

## 2014-02-04 DIAGNOSIS — M25519 Pain in unspecified shoulder: Secondary | ICD-10-CM

## 2014-02-04 MED ORDER — NITROGLYCERIN 0.2 MG/HR TD PT24: MEDICATED_PATCH | TRANSDERMAL | Status: DC

## 2014-02-04 NOTE — Patient Instructions (Signed)
Your history and exam are consistent with mild strain, tendinopathy of subscapularis and biceps tendon Try to avoid painful activities (overhead activities, lifting with extended arm) as much as possible. Consider Aleve 2 tabs twice a day with food OR ibuprofen 3 tabs three times a day with food for pain and inflammation - generally beyond 6 weeks this is more for pain than actual healing of the process. Can take tylenol in addition to this. Nitro patches 1/4th patch over affected shoulder, change daily (or do 12 hours on, 12 hours off). Subacromial injection may be beneficial to help with pain and to decrease inflammation. Do home exercise program with theraband and scapular stabilization exercises daily, bicep curls and supinations - 3 sets of 10 once a day next 6 weeks. If not improving at follow-up we will consider further imaging, physical therapy and/or injection. Follow up with me in 6 weeks.

## 2014-02-07 ENCOUNTER — Encounter: Payer: Self-pay | Admitting: Family Medicine

## 2014-02-07 DIAGNOSIS — M25511 Pain in right shoulder: Secondary | ICD-10-CM | POA: Insufficient documentation

## 2014-02-07 NOTE — Progress Notes (Signed)
Patient ID: [name], male   DOB: [birth year], 38 y.o.   MRN: [id number]  PCP: Jani Gravel, MD  Subjective:   HPI: Patient is a 38 y.o. male here for right shoulder.  Patient reports 1 1/2 months ago was doing stick drills in jiu jitsu. While doing this felt soreness in anterior shoulder though no obvious acute injury. Since then has had trouble with push ups, pullups, body weight squats. Doing external rotation, overhead lifting bothers him. Sometimes wakes him up. Not taking any medications for this.  Past Medical History  Diagnosis Date  . Laryngomalacia, congenital     resolved  . Dermoid cyst of eyelid     removed from left upper canthus area  . Shortness of breath   . Bronchitis 06/30/13  . 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 acetaminophen (TYLENOL) 325 MG tablet Take 650 mg by mouth every 6 (six) hours as needed.      Marland Kitchen ibuprofen (ADVIL,MOTRIN) 400 MG tablet Take 400 mg by mouth every 6 (six) hours as needed.       No current facility-administered medications on file prior to visit.    Past Surgical History  Procedure Laterality Date  . Tracheotomy       6 months old for 3 months  . Minor hemorrhoidectomy    . Video bronchoscopy N/A 07/06/2013    Procedure: VIDEO BRONCHOSCOPY;  Surgeon: Grace Isaac, MD;  Location: [facility];  Service: Thoracic;  Laterality: N/A;  . Mediasternotomy N/A 07/06/2013    Procedure: PARTIAL MEDIASTERNOTOMY//RESECTION OF ANTERIOR MEDIASTINAL MASS;  Surgeon: Grace Isaac, MD;  Location: [facility];  Service: Thoracic;  Laterality: N/A;  RESECTION OF ANTERIOR MEDIASTINAL MASS    No Known Allergies  History   Social History  . Marital Status: Married    Spouse Name: N/A    Number of Children: N/A  . Years of Education: N/A   Occupational History  . Not on file.   Social History Main Topics  . Smoking status: Former Smoker -- 0.25  packs/day for 3 years    Types: Cigarettes    Quit date: 07/03/1994  . Smokeless tobacco: Never Used  . Alcohol Use: Yes     Comment: socially   . Drug Use: No  . Sexual Activity: Not on file   Other Topics Concern  . Not on file   Social History Narrative  . No narrative on file    Family History  Problem Relation Age of Onset  . Cancer Paternal Grandmother     intra-abdominal malignancy  . Cancer Paternal Grandfather 48    prostate    BP 101/65  Pulse 57  Ht 5\' 6"  (1.676 m)  Wt 160 lb (72.576 kg)  BMI 25.84 kg/m2  Review of Systems: See HPI above.    Objective:  Physical Exam:  Gen: NAD  Right shoulder: No swelling, ecchymoses.  No gross deformity. Mild TTP anterior shoulder over biceps tendon FROM with minimal painful arc. Mild positive Hawkins, Neers. Negative Speeds, Yergasons. Strength 5/5 with empty can and resisted internal/external rotation.  Very mild pain ER and empty can. Negative apprehension. Mild pain o/briens. NV intact distally.  MSK u/s:  Right shoulder - biceps tendon intact on long and trans views without abnormalities.  Subscapularis, infraspinatus, and supraspinatus also intact without abnormalities.    Assessment & Plan:  1. Right shoulder  pain - consistent with mild strain, tendinopathy of subscapularis and biceps tendon.  Start home exercise program which was reviewed today. Nitro patches as well (reviewed risks of headaches, skin irritation).  NSAIDs as needed.  Consider physical therapy, injection if not improving.  F/u in 6 weeks.

## 2014-02-07 NOTE — Assessment & Plan Note (Signed)
consistent with mild strain, tendinopathy of subscapularis and biceps tendon.  Start home exercise program which was reviewed today. Nitro patches as well (reviewed risks of headaches, skin irritation).  NSAIDs as needed.  Consider physical therapy, injection if not improving.  F/u in 6 weeks.

## 2014-04-27 MED ORDER — NITROGLYCERIN IN D5W 200-5 MCG/ML-% IV SOLN
INTRAVENOUS | Status: AC
  Filled 2014-04-27: qty 250

## 2014-05-09 ENCOUNTER — Encounter: Payer: Self-pay | Admitting: Cardiothoracic Surgery

## 2014-05-09 ENCOUNTER — Ambulatory Visit (INDEPENDENT_AMBULATORY_CARE_PROVIDER_SITE_OTHER): Payer: 59 | Admitting: Cardiothoracic Surgery

## 2014-05-09 ENCOUNTER — Ambulatory Visit
Admission: RE | Admit: 2014-05-09 | Discharge: 2014-05-09 | Disposition: A | Discharge status: Home / Self Care | Payer: 59 | Source: Ambulatory Visit | Attending: Cardiothoracic Surgery | Admitting: Cardiothoracic Surgery

## 2014-05-09 VITALS — BP 103/66 | HR 60 | Ht 66.0 in | Wt 160.0 lb

## 2014-05-09 DIAGNOSIS — J9859 Other diseases of mediastinum, not elsewhere classified: Secondary | ICD-10-CM

## 2014-05-09 DIAGNOSIS — R222 Localized swelling, mass and lump, trunk: Secondary | ICD-10-CM

## 2014-05-09 NOTE — Progress Notes (Signed)
[address]       [place] [address]             [phone]      Damaree Gurmukh [place] Record #[id number] Date of Birth: 04/09/1976  Referring: Magrinat, Virgie Dad, MD Primary Care: Jani Gravel, MD  Chief Complaint:   POST OP FOLLOW UP 1. Lymph node, biopsy, Mediastinum - ONE LYMPH NODE, NEGATIVE FOR TUMOR (0/1) 2. Mediastinum, mass resection - THYMOMA, WHO TYPE B2, SEE COMMENT. - TUMOR PRESENT AT CAUTERIZED SURGICAL MARGIN. History of Present Illness:      07/06/2013  OPERATIVE REPORT  PREOPERATIVE DIAGNOSIS: Large anterior mediastinal tumor.  POSTOPERATIVE DIAGNOSIS: Large anterior mediastinal tumor, probable  thymoma.  SURGICAL PROCEDURE: Partial sternotomy with resection of anterior  mediastinal tumor, bronchoscopy.  SURGEON: Lanelle Bal, MD   Patient comes to the office today because of concern about upper sternal tenderness when he does significant number of pushups and upper body weightlifting. His wife is a physical therapist and encouraged him to come and be checked. He denies any popping or clicking. Other than when he does heavy exercise he does not notice much discomfort. He has had no shortness of breath or cough.   Past Medical History  Diagnosis Date  . Laryngomalacia, congenital     resolved  . Dermoid cyst of eyelid     removed from left upper canthus area  . Shortness of breath   . Bronchitis 06/30/13  . 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 Former Smoker -- 0.25 packs/day for 3 years  . Types: Cigarettes  . Quit date: 07/03/1994  Smokeless tobacco  . Never Used    History  Alcohol Use  . Yes    Comment: socially      No Known Allergies  Current Outpatient Prescriptions  Medication Sig Dispense Refill  . acetaminophen (TYLENOL) 325 MG tablet Take 650 mg by mouth every 6 (six) hours as needed.      Marland Kitchen ibuprofen (ADVIL,MOTRIN) 400 MG tablet Take 400 mg by mouth every 6  (six) hours as needed.      . nitroGLYCERIN (NITRODUR - DOSED IN MG/24 HR) 0.2 mg/hr patch 1/4th patch over affected shoulder and change daily  30 patch  1   No current facility-administered medications for this visit.       Physical Exam: BP 103/66  Pulse 60  Ht 5\' 6"  (1.676 m)  Wt 160 lb (72.576 kg)  BMI 25.84 kg/m2  SpO2 99%  General appearance: alert and cooperative Neurologic: intact Heart: regular rate and rhythm, S1, S2 normal, no murmur, click, rub or gallop Lungs: clear to auscultation bilaterally Abdomen: soft, non-tender; bowel sounds normal; no masses,  no organomegaly Extremities: extremities normal, atraumatic, no cyanosis or edema and Homans sign is negative, no sign of DVT Wound: On exam his sternum is stable the incision is well-healed there is no point tenderness to suggest pain caused by the end of a sternal wire or fractured wires   Diagnostic Studies & Laboratory data:     Recent Radiology Findings:   Dg Chest 2 View  05/09/2014   CLINICAL DATA:  Chest pain.  Prior history of thymoma.  EXAM: CHEST  2 VIEW  COMPARISON:  MRI chest 12/10/2013.  Chest x-ray 11/17/ 2014.  FINDINGS: Mediastinum and hilar structures are normal. Heart size normal. Prior median sternotomy. Lungs are clear. No pleural effusion  or pneumothorax. No acute bony abnormality. Degenerative changes thoracic spine.  IMPRESSION: No active cardiopulmonary disease.   Electronically Signed   By: Marcello Moores  Register   On: 05/09/2014 10:20    Chest x-ray does not suggest any malunion of the sternum or misplaced or fractured sternal wires. There is no evidence of mediastinal mass on chest x-ray  Recent Lab Findings: Lab Results  Component Value Date   WBC 7.6 07/08/2013   HGB 12.5* 07/08/2013   HCT 37.6* 07/08/2013   PLT 278 07/08/2013   GLUCOSE 121* 07/08/2013   ALT 14 07/08/2013   AST 28 07/08/2013   NA 142 07/08/2013   K 4.8 07/08/2013   CL 106 07/08/2013   CREATININE 0.78 07/08/2013   BUN 8 07/08/2013    CO2 27 07/08/2013   INR 0.91 07/05/2013      Assessment / Plan:     Patient doing well following resection of thymoma with some upper sternal discomfort with heavy exercise, there is no evidence on exam or x-ray of malunion or fractured sternal wire. The x-rays were reviewed with the patient.  I plan to see him back in October after his previously scheduled MRI of the chest both to review the followup findings and to check on his sternal incision.   Grace Isaac MD      [address] [place] [address] Office [phone]   Beeper [phone]  05/09/2014 11:18 AM

## 2014-06-11 ENCOUNTER — Ambulatory Visit (HOSPITAL_COMMUNITY)
Admission: RE | Admit: 2014-06-11 | Discharge: 2014-06-11 | Disposition: A | Discharge status: Home / Self Care | Payer: 59 | Source: Ambulatory Visit | Attending: Oncology | Admitting: Oncology

## 2014-06-11 DIAGNOSIS — Z9889 Other specified postprocedural states: Secondary | ICD-10-CM | POA: Diagnosis not present

## 2014-06-11 DIAGNOSIS — D4989 Neoplasm of unspecified behavior of other specified sites: Secondary | ICD-10-CM

## 2014-06-11 DIAGNOSIS — D150 Benign neoplasm of thymus: Secondary | ICD-10-CM | POA: Insufficient documentation

## 2014-06-11 MED ORDER — GADOBENATE DIMEGLUMINE 529 MG/ML IV SOLN
15.0000 mL | Freq: Once | INTRAVENOUS | Status: AC
  Administered 2014-06-11: 15 mL via INTRAVENOUS

## 2014-06-12 ENCOUNTER — Other Ambulatory Visit: Payer: Self-pay | Admitting: Oncology

## 2014-06-13 ENCOUNTER — Ambulatory Visit: Payer: Self-pay | Admitting: Oncology

## 2014-06-13 ENCOUNTER — Telehealth: Payer: Self-pay | Admitting: Oncology

## 2014-06-13 NOTE — Progress Notes (Signed)
[name]'s test MRI is negative. IIA cancel his visit here today, as I saw him "curbside" in the hospital, and I have set him up for a repeat MRI and visit a year from now.

## 2014-06-13 NOTE — Telephone Encounter (Signed)
cx 10/15 appt per pt - returned pt call and per pt not r/s at this and he has s/w GM

## 2014-06-14 ENCOUNTER — Telehealth: Payer: Self-pay | Admitting: Oncology

## 2014-06-14 NOTE — Telephone Encounter (Signed)
LM to  confirm d/t for 05/2015. Mailed cal.

## 2014-06-20 ENCOUNTER — Ambulatory Visit: Payer: 59 | Admitting: Cardiothoracic Surgery

## 2014-07-04 ENCOUNTER — Encounter: Payer: Self-pay | Admitting: Cardiothoracic Surgery

## 2014-07-04 ENCOUNTER — Ambulatory Visit (INDEPENDENT_AMBULATORY_CARE_PROVIDER_SITE_OTHER): Payer: 59 | Admitting: Cardiothoracic Surgery

## 2014-07-04 VITALS — BP 115/71 | HR 55 | Resp 20 | Ht 66.0 in | Wt 165.0 lb

## 2014-07-04 DIAGNOSIS — D150 Benign neoplasm of thymus: Secondary | ICD-10-CM

## 2014-07-04 DIAGNOSIS — D4989 Neoplasm of unspecified behavior of other specified sites: Secondary | ICD-10-CM

## 2014-07-04 NOTE — Progress Notes (Signed)
[address]       [place] [address]             [phone]      Lemon Gurmukh [place] Record #[id number] Date of Birth: May 07, 1976  Referring: Magrinat, Virgie Dad, MD Primary Care: Jani Gravel, MD  Chief Complaint:   POST OP FOLLOW UP 1. Lymph node, biopsy, Mediastinum - ONE LYMPH NODE, NEGATIVE FOR TUMOR (0/1) 2. Mediastinum, mass resection - THYMOMA, WHO TYPE B2, SEE COMMENT. - TUMOR PRESENT AT CAUTERIZED SURGICAL MARGIN. History of Present Illness:      07/06/2013  OPERATIVE REPORT  PREOPERATIVE DIAGNOSIS: Large anterior mediastinal tumor.  POSTOPERATIVE DIAGNOSIS: Large anterior mediastinal tumor, probable  thymoma.  SURGICAL PROCEDURE: Partial sternotomy with resection of anterior  mediastinal tumor, bronchoscopy.  SURGEON: Lanelle Bal, MD   Patient comes to the office today after a follow-up MRI of the chest proximally one year after previous resection of a thymoma.  The patient still has some concern about upper sternal tenderness when he does significant number of pushups and upper body weightlifting, otherwise he is asymptomatic.  He denies any popping or clicking. Other than when he does heavy exercise he does not notice much discomfort. He has had no shortness of breath or cough.   Past Medical History  Diagnosis Date  . Laryngomalacia, congenital     resolved  . Dermoid cyst of eyelid     removed from left upper canthus area  . Shortness of breath   . Bronchitis 06/30/13  . 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 Former Smoker -- 0.25 packs/day for 3 years  . Types: Cigarettes  . Quit date: 07/03/1994  Smokeless tobacco  . Never Used    History  Alcohol Use  . Yes    Comment: socially      No Known Allergies  Current Outpatient Prescriptions  Medication Sig Dispense Refill  . acetaminophen (TYLENOL) 325 MG tablet Take 650 mg by mouth every 6 (six) hours as needed.    Marland Kitchen  ibuprofen (ADVIL,MOTRIN) 400 MG tablet Take 400 mg by mouth every 6 (six) hours as needed.    . nitroGLYCERIN (NITRODUR - DOSED IN MG/24 HR) 0.2 mg/hr patch 1/4th patch over affected shoulder and change daily 30 patch 1   No current facility-administered medications for this visit.       Physical Exam: BP 115/71 mmHg  Pulse 55  Resp 20  Ht 5\' 6"  (1.676 m)  Wt 165 lb (74.844 kg)  BMI 26.64 kg/m2  SpO2 98%  General appearance: alert and cooperative Neurologic: intact Heart: regular rate and rhythm, S1, S2 normal, no murmur, click, rub or gallop Lungs: clear to auscultation bilaterally Abdomen: soft, non-tender; bowel sounds normal; no masses,  no organomegaly Extremities: extremities normal, atraumatic, no cyanosis or edema and Homans sign is negative, no sign of DVT Wound: On exam his sternum is stable the incision is well-healed there is no point tenderness to suggest pain caused by the end of a sternal wire or fractured wires Patient has no cervical or supraclavicular adenopathy  Diagnostic Studies & Laboratory data:     Recent Radiology Findings: Mr Chest W Wo Contrast  06/11/2014   CLINICAL DATA:  38 year old male with prior history of thymoma status post resection in 2014. Evaluate for recurrent disease.  EXAM: MRI CHEST WITHOUT AND WITH CONTRAST  TECHNIQUE: Multiplanar multi sequence MR  imaging of the thorax was performed before and after the administration of IV gadolinium.  CONTRAST:  40mL MULTIHANCE GADOBENATE DIMEGLUMINE 529 MG/ML IV SOLN  COMPARISON:  Multiple priors, most recently 12/10/2013.  FINDINGS: Postoperative changes of median sternotomy for prior thymoma resection are again noted. No abnormal soft tissue mass in the anterior mediastinum or abnormal enhancement on post gadolinium images to suggest recurrence of disease. Additionally, there are no pleural based lesions to suggest metastatic disease to the pleura. Heart size is normal. No significant pericardial fluid  or thickening. No lymphadenopathy noted in the mediastinum or hilar stations. Although MRI is poor at evaluating the lung parenchyma secondary to susceptibility artifact from air, no discrete pulmonary lesions are noted. Visualized skeletal structures and upper abdominal structures are unremarkable. Bilateral gynecomastia is again noted.  IMPRESSION: 1. Status post median sternotomy for resection of thymoma, without findings to suggest recurrence of disease.   Electronically Signed   By: Vinnie Langton M.D.   On: 06/11/2014 10:44     Chest x-ray does not suggest any malunion of the sternum or misplaced or fractured sternal wires. There is no evidence of mediastinal mass on chest x-ray  Recent Lab Findings: Lab Results  Component Value Date   WBC 7.6 07/08/2013   HGB 12.5* 07/08/2013   HCT 37.6* 07/08/2013   PLT 278 07/08/2013   GLUCOSE 121* 07/08/2013   ALT 14 07/08/2013   AST 28 07/08/2013   NA 142 07/08/2013   K 4.8 07/08/2013   CL 106 07/08/2013   CREATININE 0.78 07/08/2013   BUN 8 07/08/2013   CO2 27 07/08/2013   INR 0.91 07/05/2013      Assessment / Plan:     Patient doing well following resection of thymoma with some upper sternal discomfort with heavy exercise, there is no evidence on exam or x-ray of malunion or fractured sternal wire. No evidence of tumor recurrence by MRI. The x-rays were reviewed with the patient.  I plan to see him back in October 2016  after his previously scheduled MRI of the ches.   Grace Isaac MD      [employer] [place] [address] Office [phone]   Beeper [phone]  07/04/2014 10:03 AM

## 2015-01-20 IMAGING — CT CT ANGIO CHEST
1 of 2 series · 18 of 32 positions shown · IV contrast (OMNIPAQUE 300)
Comparison: None.

CLINICAL DATA: Initial encounter for chest pain and shortness of
breath. Elevated D-dimer.

EXAM:
CT ANGIOGRAPHY CHEST WITH CONTRAST
TECHNIQUE: Multidetector CT imaging of the chest was performed using the
standard protocol during bolus administration of intravenous
contrast. Multiplanar CT image reconstructions including MIPs were
obtained to evaluate the vascular anatomy.
CONTRAST:  80mL OMNIPAQUE IOHEXOL 350 MG/ML IV.

[Series 12: thins for pacs · axial · 0.74mm/px · z∈[+1078,+1308]mm · 18 of 256 slices shown]
[im 13/256  lung]
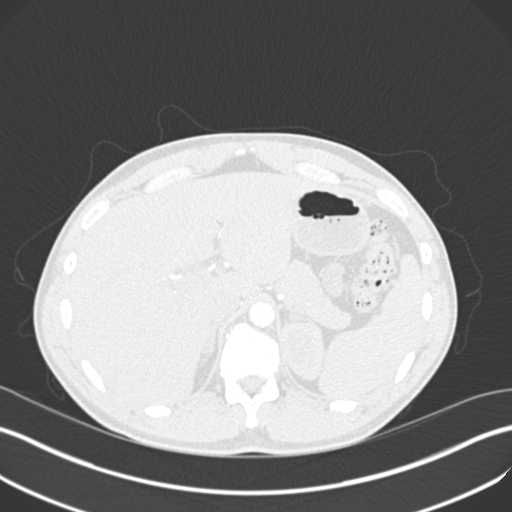
[im 26/256  mediastinal]
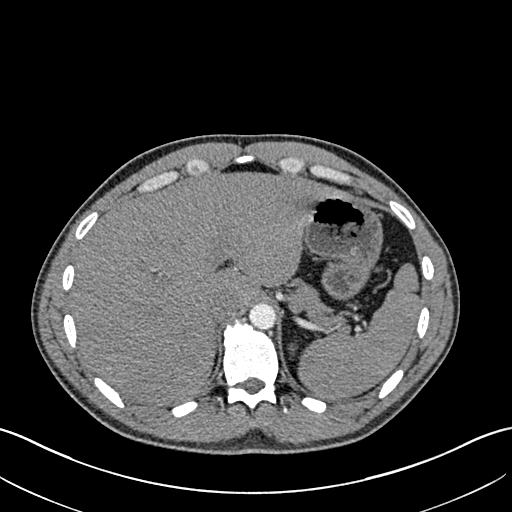
[im 52/256  lung]
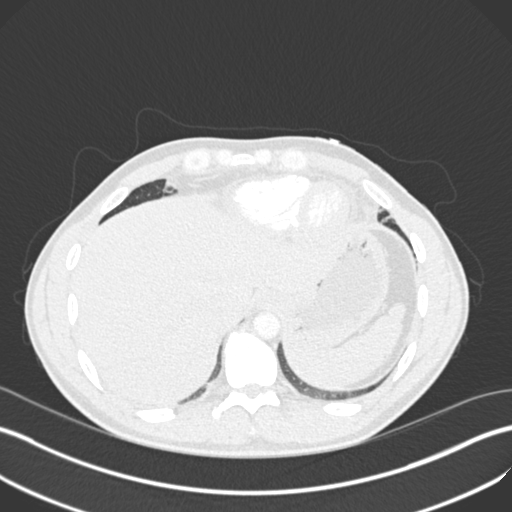
[im 64/256  mediastinal]
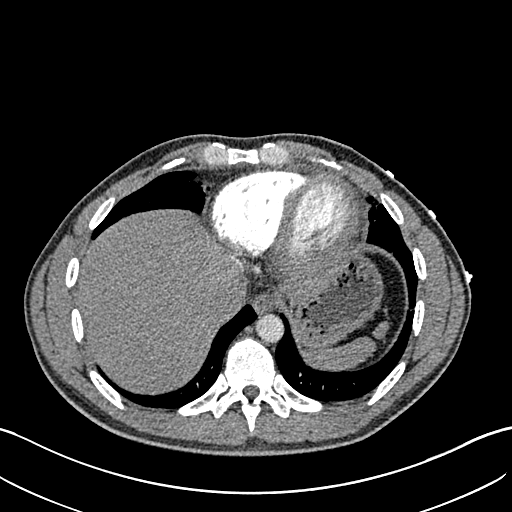
[im 77/256  lung]
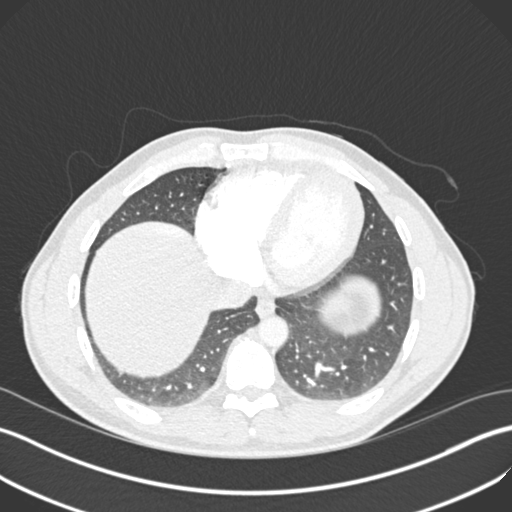
[im 86/256  mediastinal]
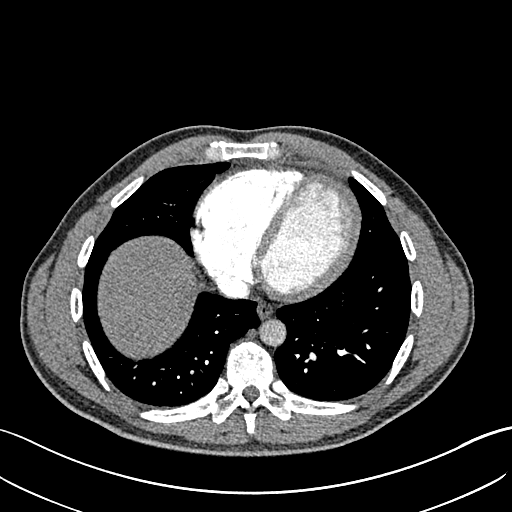
[im 90/256  lung]
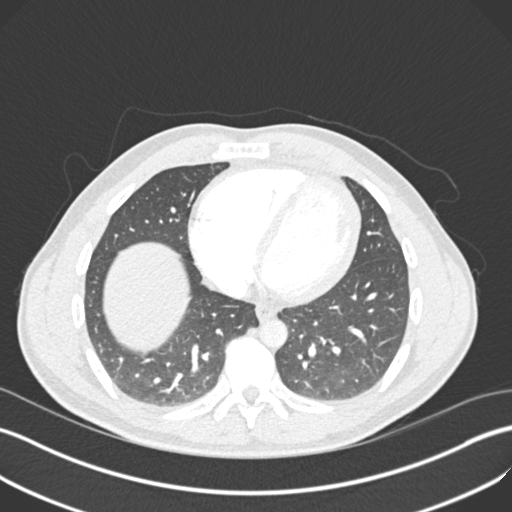
[im 115/256  mediastinal]
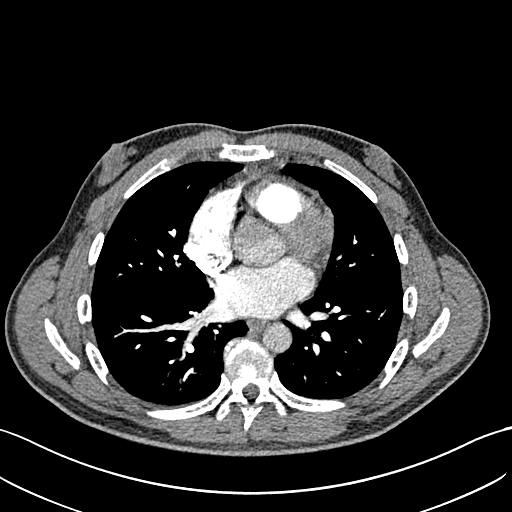
[im 119/256  lung]
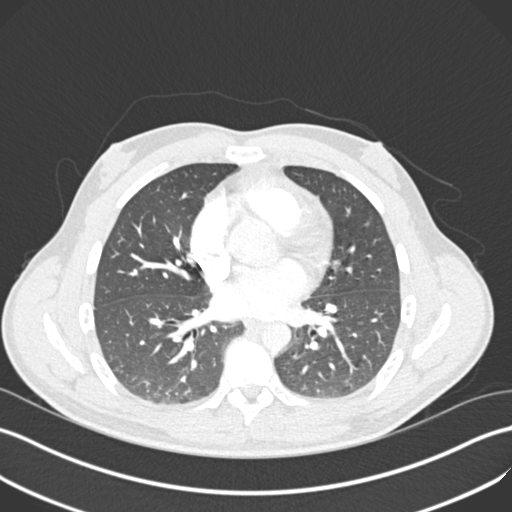
[im 128/256  mediastinal]
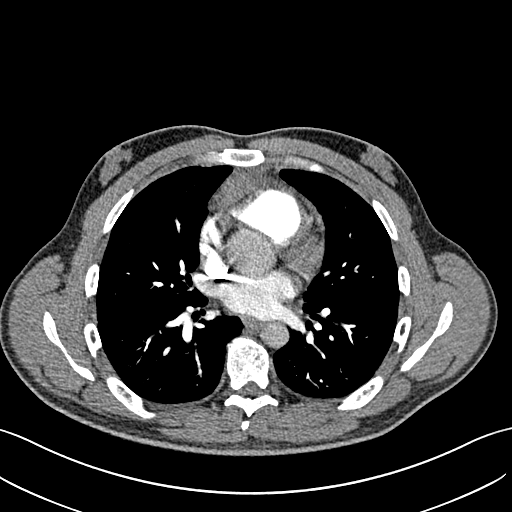
[im 141/256  lung]
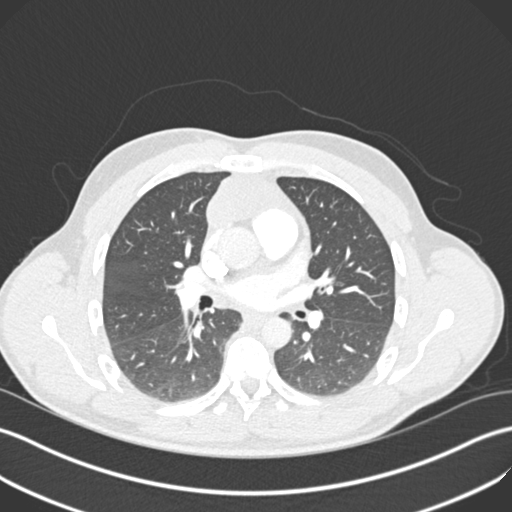
[im 166/256  mediastinal]
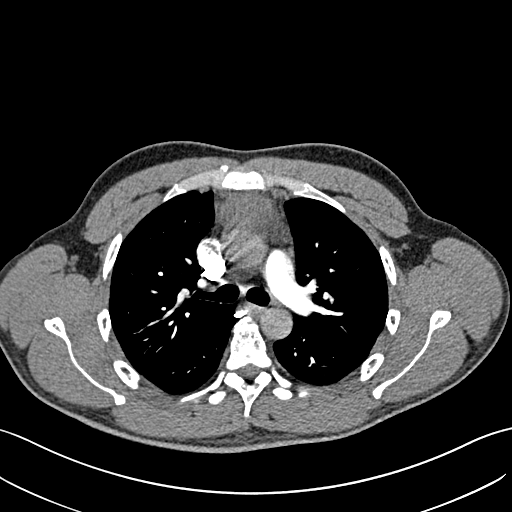
[im 171/256  lung]
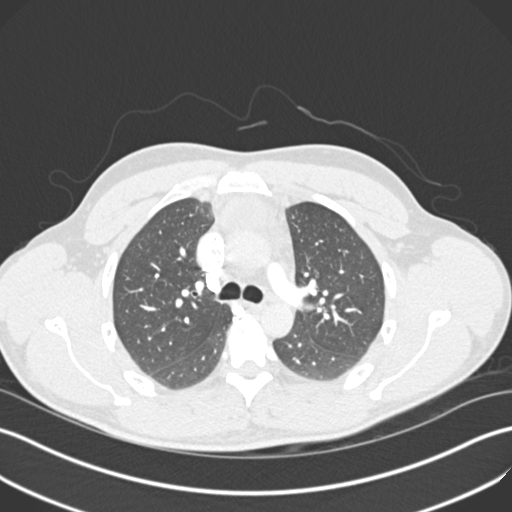
[im 179/256  mediastinal]
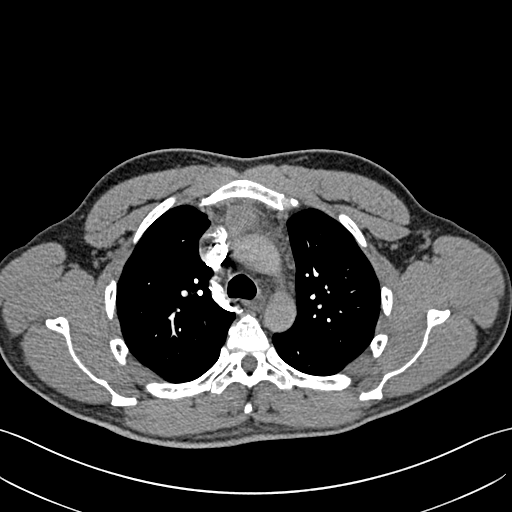
[im 192/256  lung]
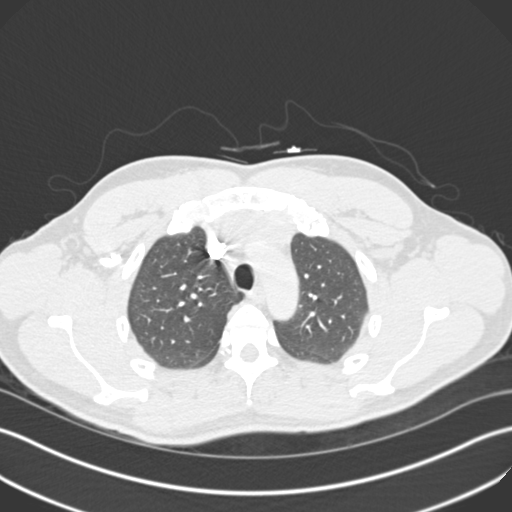
[im 205/256  mediastinal]
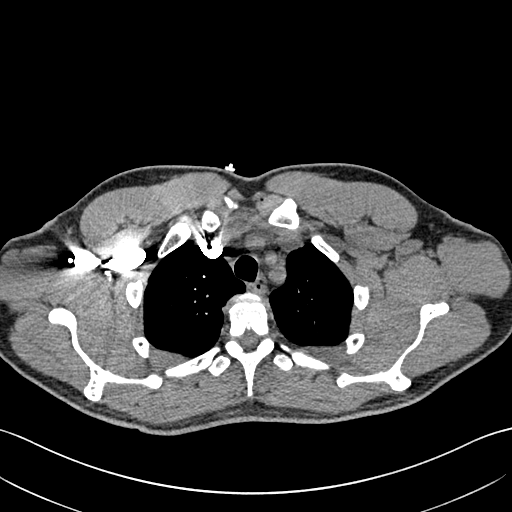
[im 230/256  lung]
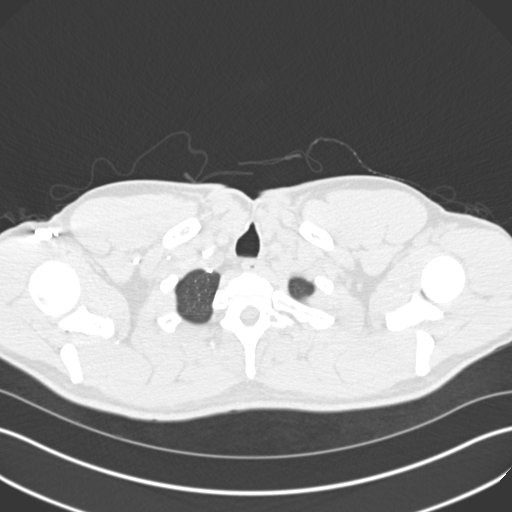
[im 243/256  mediastinal]
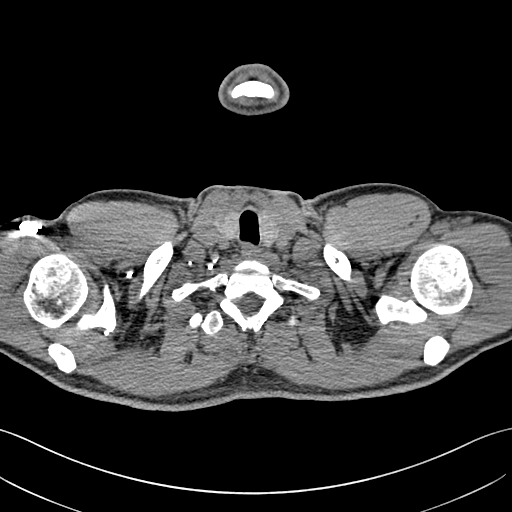

[18 of 32 positions shown; findings below may reference images not displayed]

FINDINGS: Contrast opacification of the pulmonary arteries is very good. No
filling defects within either main pulmonary artery or their
branches in either lung to suggest pulmonary embolism. Heart size
normal. No pericardial effusion. No visible coronary
atherosclerosis. Mild calcified plaque in the proximal descending
thoracic aorta, without evidence of atherosclerosis elsewhere.

Mass in the anterior superior mediastinum measuring approximately
3.7 x 5.9 x 4.8 cm (series 5, image 37 and series 9, image 49). No
mass or lymphadenopathy elsewhere in the mediastinum, either hilum,
or either axilla. Visualized thyroid gland unremarkable.

Pulmonary parenchyma clear without evidence of localized airspace
consolidation, interstitial disease, or parenchymal nodules or
masses. No pleural effusions. Central airways patent with mild
bronchial wall thickening.

Moderate bilateral gynecomastia. Possible hepatosplenomegaly, though
these organs are incompletely imaged. Calcified granuloma in the
anterior segment right lobe of liver. Visualized upper abdomen
otherwise unremarkable. Bone window images demonstrate moderate
thoracic spondylosis.

Review of the MIP images confirms the above findings.
IMPRESSION: 1. No evidence of pulmonary embolism.
2. Anterior superior mediastinal mass, measured above, without
evidence of mass or lymphadenopathy elsewhere. Differential
diagnosis would include thymoma, lymphoma, and teratoma.
3. Mild central bronchial wall thickening is consistent with asthma
and/or bronchitis. No acute cardiopulmonary disease otherwise.
4. Moderate bilateral gynecomastia.
5. Possible hepatosplenomegaly, though these organs were not
completely imaged on the chest CT.
6. Minimal calcified plaque in the proximal descending thoracic
aorta, indicating advanced atherosclerosis for age.

## 2015-03-21 ENCOUNTER — Ambulatory Visit (INDEPENDENT_AMBULATORY_CARE_PROVIDER_SITE_OTHER): Payer: 59 | Admitting: Internal Medicine

## 2015-03-21 DIAGNOSIS — Z9189 Other specified personal risk factors, not elsewhere classified: Secondary | ICD-10-CM

## 2015-03-21 MED ORDER — ATOVAQUONE-PROGUANIL HCL 250-100 MG PO TABS: 1.0000 | ORAL_TABLET | Freq: Every day | ORAL | Status: DC

## 2015-03-21 MED ORDER — AZITHROMYCIN 500 MG PO TABS: 500.0000 mg | ORAL_TABLET | Freq: Every day | ORAL | Status: DC

## 2015-03-21 NOTE — Progress Notes (Signed)
   Subjective:    Patient ID: [name], male    DOB: 1975-12-10, 39 y.o.   MRN: [id number]  HPI 39 yo M who is going on family trip to [place], from aug 2nd-sept 12. He previously came to travel clinic last year for trip to [place].  He is vaccinated to hep a, hep b, typhoid, flu, mmr, tetanus in sept 2006  No Known Allergies Current Outpatient Prescriptions on File Prior to Visit  Medication Sig Dispense Refill  . acetaminophen (TYLENOL) 325 MG tablet Take 650 mg by mouth every 6 (six) hours as needed.    Marland Kitchen ibuprofen (ADVIL,MOTRIN) 400 MG tablet Take 400 mg by mouth every 6 (six) hours as needed.    . nitroGLYCERIN (NITRODUR - DOSED IN MG/24 HR) 0.2 mg/hr patch 1/4th patch over affected shoulder and change daily 30 patch 1   No current facility-administered medications on file prior to visit.   Active Ambulatory Problems    Diagnosis Date Noted  . Rotator cuff disorder 02/22/2012  . Shortness of breath 07/03/2013  . Thymoma 07/06/2013  . Right shoulder pain 02/07/2014   Resolved Ambulatory Problems    Diagnosis Date Noted  . Swelling, mass, or lump in chest 07/03/2013   Past Medical History  Diagnosis Date  . Laryngomalacia, congenital   . Dermoid cyst of eyelid   . Bronchitis 06/30/13      Review of Systems     Objective:   Physical Exam        Assessment & Plan:  Pre travel vaccination = recommend to get tetanus in the Fall. Can consider getting it early  Malaria prophylaxis = gave rx for malarone for [place] portion of trip. #24 tabs NR  Mosquito bite prevention = discussed using deet and premethrin spray  Traveler's diarrhea = gave rx for azithromycin

## 2015-06-09 ENCOUNTER — Ambulatory Visit (HOSPITAL_COMMUNITY): Admission: RE | Admit: 2015-06-09 | Payer: 59 | Source: Ambulatory Visit

## 2015-06-13 ENCOUNTER — Telehealth: Payer: Self-pay | Admitting: Oncology

## 2015-06-13 NOTE — Telephone Encounter (Signed)
Returned patients call and he request to reschedule his appointment

## 2015-06-16 ENCOUNTER — Ambulatory Visit: Payer: 59 | Admitting: Oncology

## 2015-06-26 ENCOUNTER — Ambulatory Visit: Payer: 59 | Admitting: Cardiothoracic Surgery

## 2015-07-11 ENCOUNTER — Other Ambulatory Visit: Payer: Self-pay | Admitting: Oncology

## 2015-07-18 ENCOUNTER — Other Ambulatory Visit: Payer: Self-pay | Admitting: Oncology

## 2015-07-18 ENCOUNTER — Ambulatory Visit (HOSPITAL_COMMUNITY)
Admission: RE | Admit: 2015-07-18 | Discharge: 2015-07-18 | Disposition: A | Discharge status: Home / Self Care | Payer: 59 | Source: Ambulatory Visit | Attending: Oncology | Admitting: Oncology

## 2015-07-18 DIAGNOSIS — N62 Hypertrophy of breast: Secondary | ICD-10-CM | POA: Insufficient documentation

## 2015-07-18 DIAGNOSIS — D150 Benign neoplasm of thymus: Secondary | ICD-10-CM | POA: Insufficient documentation

## 2015-07-18 DIAGNOSIS — D4989 Neoplasm of unspecified behavior of other specified sites: Secondary | ICD-10-CM

## 2015-07-18 MED ORDER — GADOBENATE DIMEGLUMINE 529 MG/ML IV SOLN
15.0000 mL | Freq: Once | INTRAVENOUS | Status: AC | PRN
  Administered 2015-07-18: 15 mL via INTRAVENOUS

## 2015-07-18 NOTE — Progress Notes (Unsigned)
[facility] with results, which are excellent. He would like to back off a little from M.D. appointments and I have sent Dr. Servando Snare a note suggesting we cancel the December one appointment there. I am going to meet with J sometime in December and decide how to follow the thymoma issue in the future.

## 2015-07-19 ENCOUNTER — Encounter: Payer: Self-pay | Admitting: Oncology

## 2015-07-31 ENCOUNTER — Ambulatory Visit: Payer: 59 | Admitting: Cardiothoracic Surgery

## 2015-08-20 ENCOUNTER — Ambulatory Visit (HOSPITAL_BASED_OUTPATIENT_CLINIC_OR_DEPARTMENT_OTHER): Payer: Self-pay | Admitting: Oncology

## 2015-08-20 DIAGNOSIS — D150 Benign neoplasm of thymus: Secondary | ICD-10-CM

## 2015-08-20 DIAGNOSIS — C37 Malignant neoplasm of thymus: Secondary | ICD-10-CM

## 2015-08-20 NOTE — Progress Notes (Signed)
No show-- called to cancel

## 2015-08-22 ENCOUNTER — Telehealth: Payer: Self-pay | Admitting: Oncology

## 2015-08-22 NOTE — Telephone Encounter (Signed)
Appointments made and a new calendar mailed to patient

## 2015-12-12 ENCOUNTER — Other Ambulatory Visit: Payer: Self-pay | Admitting: Oncology

## 2016-01-08 ENCOUNTER — Ambulatory Visit (INDEPENDENT_AMBULATORY_CARE_PROVIDER_SITE_OTHER): Payer: 59 | Admitting: Internal Medicine

## 2016-01-08 DIAGNOSIS — Z9189 Other specified personal risk factors, not elsewhere classified: Secondary | ICD-10-CM

## 2016-01-08 MED ORDER — TYPHOID VACCINE PO CPDR: 1.0000 | DELAYED_RELEASE_CAPSULE | ORAL | Status: DC

## 2016-01-08 MED ORDER — AZITHROMYCIN 500 MG PO TABS: 500.0000 mg | ORAL_TABLET | Freq: Every day | ORAL | Status: DC

## 2016-01-08 NOTE — Progress Notes (Signed)
  RFV: pre-travel counseling for upcoming 3 week trip to [place] Subjective:    Patient ID: [name], male    DOB: 07-May-1976, 40 y.o.   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 He is leaving June 14th for 3 week trip to [place]. Will be traveling with his wife and son.  Previously traveled to [place], [place] in the last 2 years  He has had injectable typhoid in may 2015  No Known Allergies Current Outpatient Prescriptions on File Prior to Visit  Medication Sig Dispense Refill  . acetaminophen (TYLENOL) 325 MG tablet Take 650 mg by mouth every 6 (six) hours as needed.    Marland Kitchen atovaquone-proguanil (MALARONE) 250-100 MG TABS Take 1 tablet by mouth daily. Start taking on aug 27th, take daily until complete 24 tablet 0  . azithromycin (ZITHROMAX) 500 MG tablet Take 1 tablet (500 mg total) by mouth daily. If 3+loose stools/24hr. Can stop taking if diarrhea resolves 10 tablet 0  . ibuprofen (ADVIL,MOTRIN) 400 MG tablet Take 400 mg by mouth every 6 (six) hours as needed.    . nitroGLYCERIN (NITRODUR - DOSED IN MG/24 HR) 0.2 mg/hr patch 1/4th patch over affected shoulder and change daily 30 patch 1   No current facility-administered medications on file prior to visit.   Active Ambulatory Problems    Diagnosis Date Noted  . Rotator cuff disorder 02/22/2012  . Shortness of breath 07/03/2013  . Thymoma 07/06/2013  . Right shoulder pain 02/07/2014   Resolved Ambulatory Problems    Diagnosis Date Noted  . Swelling, mass, or lump in chest 07/03/2013   Past Medical History  Diagnosis Date  . Laryngomalacia, congenital   . Dermoid cyst of eyelid   . Bronchitis 06/30/13      Review of Systems     Objective:   Physical Exam      Assessment & Plan:  Pre travel vaccination = will give rx for oral typhoid vaccine  Mosquito bite prevention = gave precautions on using  deet and premethrin for clothing. No need for malaria prophylaxis based upon travel itinerary  Traveler's diarrhea = will give rx for azithromycin to use if needed

## 2016-02-07 ENCOUNTER — Other Ambulatory Visit: Payer: Self-pay | Admitting: Oncology

## 2016-04-26 DIAGNOSIS — Z0000 Encounter for general adult medical examination without abnormal findings: Secondary | ICD-10-CM | POA: Diagnosis not present

## 2016-04-26 DIAGNOSIS — Z125 Encounter for screening for malignant neoplasm of prostate: Secondary | ICD-10-CM | POA: Diagnosis not present

## 2016-04-27 DIAGNOSIS — Z0000 Encounter for general adult medical examination without abnormal findings: Secondary | ICD-10-CM | POA: Diagnosis not present

## 2016-04-27 DIAGNOSIS — D649 Anemia, unspecified: Secondary | ICD-10-CM | POA: Diagnosis not present

## 2016-04-27 DIAGNOSIS — B360 Pityriasis versicolor: Secondary | ICD-10-CM | POA: Diagnosis not present

## 2016-06-03 ENCOUNTER — Telehealth: Payer: Self-pay | Admitting: Oncology

## 2016-06-03 NOTE — Telephone Encounter (Signed)
12/7 Appointment rescheduled to 09/08/16 per patient request.

## 2016-06-15 ENCOUNTER — Other Ambulatory Visit: Payer: Self-pay | Admitting: Oncology

## 2016-06-22 ENCOUNTER — Other Ambulatory Visit: Payer: Self-pay | Admitting: Oncology

## 2016-06-28 ENCOUNTER — Other Ambulatory Visit: Payer: Self-pay | Admitting: Oncology

## 2016-06-28 DIAGNOSIS — D4989 Neoplasm of unspecified behavior of other specified sites: Secondary | ICD-10-CM

## 2016-06-28 DIAGNOSIS — D150 Benign neoplasm of thymus: Principal | ICD-10-CM

## 2016-06-28 DIAGNOSIS — R0602 Shortness of breath: Secondary | ICD-10-CM

## 2016-07-16 ENCOUNTER — Ambulatory Visit (HOSPITAL_COMMUNITY)
Admission: RE | Admit: 2016-07-16 | Discharge: 2016-07-16 | Disposition: A | Discharge status: Home / Self Care | Payer: 59 | Source: Ambulatory Visit | Attending: Oncology | Admitting: Oncology

## 2016-07-16 ENCOUNTER — Other Ambulatory Visit: Payer: Self-pay | Admitting: Oncology

## 2016-07-16 ENCOUNTER — Telehealth: Payer: Self-pay | Admitting: *Deleted

## 2016-07-16 DIAGNOSIS — C37 Malignant neoplasm of thymus: Secondary | ICD-10-CM | POA: Diagnosis not present

## 2016-07-16 DIAGNOSIS — R0602 Shortness of breath: Secondary | ICD-10-CM | POA: Insufficient documentation

## 2016-07-16 DIAGNOSIS — N62 Hypertrophy of breast: Secondary | ICD-10-CM | POA: Insufficient documentation

## 2016-07-16 DIAGNOSIS — D150 Benign neoplasm of thymus: Secondary | ICD-10-CM | POA: Diagnosis not present

## 2016-07-16 DIAGNOSIS — D4989 Neoplasm of unspecified behavior of other specified sites: Secondary | ICD-10-CM

## 2016-07-16 MED ORDER — GADOBENATE DIMEGLUMINE 529 MG/ML IV SOLN
15.0000 mL | Freq: Once | INTRAVENOUS | Status: AC | PRN
  Administered 2016-07-16: 15 mL via INTRAVENOUS

## 2016-07-16 NOTE — Telephone Encounter (Signed)
Patient called requesting scheduling.  Call transferred.  Patient then asked for return call with results of today's MRI chest.  Will notify provider.

## 2016-07-19 ENCOUNTER — Encounter: Payer: Self-pay | Admitting: Oncology

## 2016-08-05 ENCOUNTER — Ambulatory Visit: Payer: 59 | Admitting: Oncology

## 2016-08-20 ENCOUNTER — Telehealth: Payer: Self-pay | Admitting: Oncology

## 2016-08-20 NOTE — Telephone Encounter (Signed)
Patient called to cancel appointment with Dr Jana Hakim, scheduled for 09/23/16. Per patient, will call back to reschedule. 08/20/16

## 2016-09-08 ENCOUNTER — Ambulatory Visit: Payer: 59 | Admitting: Oncology

## 2016-09-23 ENCOUNTER — Ambulatory Visit: Payer: 59 | Admitting: Oncology

## 2018-01-04 ENCOUNTER — Other Ambulatory Visit: Payer: Self-pay | Admitting: Internal Medicine

## 2018-01-04 ENCOUNTER — Encounter: Payer: 59 | Admitting: Internal Medicine

## 2018-01-04 MED ORDER — ATOVAQUONE-PROGUANIL HCL 250-100 MG PO TABS: 1.0000 | ORAL_TABLET | Freq: Every day | ORAL | 0 refills | Status: DC

## 2018-01-04 MED FILL — ATOVAQUONE-PROGUANIL 250-10: 250-100 | 34 days supply | Qty: 34 | Fill #0

## 2018-01-17 DIAGNOSIS — Z0000 Encounter for general adult medical examination without abnormal findings: Secondary | ICD-10-CM | POA: Diagnosis not present

## 2018-01-17 MED FILL — FLUCONAZOLE 150 MG TABS: 150 | 14 days supply | Qty: 4 | Fill #0

## 2018-04-14 MED FILL — FLUCONAZOLE 150 MG TABS: 150 | 14 days supply | Qty: 4 | Fill #1

## 2018-05-10 ENCOUNTER — Other Ambulatory Visit: Payer: Self-pay | Admitting: Oncology

## 2018-05-10 DIAGNOSIS — D150 Benign neoplasm of thymus: Principal | ICD-10-CM

## 2018-05-10 DIAGNOSIS — D4989 Neoplasm of unspecified behavior of other specified sites: Secondary | ICD-10-CM

## 2018-05-10 NOTE — Progress Notes (Unsigned)
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  He will have one final one this November assuming it remains negative as we hope

## 2018-06-12 ENCOUNTER — Telehealth: Payer: Self-pay

## 2018-06-12 ENCOUNTER — Other Ambulatory Visit: Payer: Self-pay | Admitting: Oncology

## 2018-06-12 NOTE — Telephone Encounter (Signed)
Spoke with scheduler from [facility]. They had not called pt to schedule MRI because it was ordered with a location of [facility].   Scheduler states she will change location to [facility] and call pt to get him scheduled.

## 2018-06-20 ENCOUNTER — Other Ambulatory Visit: Payer: Self-pay | Admitting: Oncology

## 2018-06-20 ENCOUNTER — Ambulatory Visit
Admission: RE | Admit: 2018-06-20 | Discharge: 2018-06-20 | Disposition: A | Discharge status: Home / Self Care | Payer: 59 | Source: Ambulatory Visit | Attending: Oncology | Admitting: Oncology

## 2018-06-20 DIAGNOSIS — D4989 Neoplasm of unspecified behavior of other specified sites: Secondary | ICD-10-CM

## 2018-06-20 DIAGNOSIS — D150 Benign neoplasm of thymus: Secondary | ICD-10-CM | POA: Diagnosis not present

## 2018-06-20 MED ORDER — GADOBENATE DIMEGLUMINE 529 MG/ML IV SOLN
13.0000 mL | Freq: Once | INTRAVENOUS | Status: AC | PRN
  Administered 2018-06-20: 13 mL via INTRAVENOUS

## 2018-06-21 ENCOUNTER — Other Ambulatory Visit: Payer: Self-pay | Admitting: Oncology

## 2018-06-21 NOTE — Progress Notes (Signed)
I called [name] with the results of his chest MRI which are very favorable.  We are waiting on Dr. Everrett Coombe input.  NCCN guidelines recommend yearly scans for total of 10 years from surgery which would be 6 more years.

## 2018-07-06 ENCOUNTER — Other Ambulatory Visit: Payer: Self-pay | Admitting: Oncology

## 2018-07-06 DIAGNOSIS — D4989 Neoplasm of unspecified behavior of other specified sites: Secondary | ICD-10-CM

## 2018-07-06 DIAGNOSIS — D150 Benign neoplasm of thymus: Principal | ICD-10-CM

## 2018-07-06 NOTE — Progress Notes (Signed)
Discussed screening studies with [name] and Dr. Servando Snare.  We will do another chest MRI October 2020, then possibly every 2 years x 2 .

## 2018-07-07 ENCOUNTER — Other Ambulatory Visit: Payer: Self-pay | Admitting: *Deleted

## 2019-01-16 DIAGNOSIS — Z0000 Encounter for general adult medical examination without abnormal findings: Secondary | ICD-10-CM | POA: Diagnosis not present

## 2019-02-09 DIAGNOSIS — Z20828 Contact with and (suspected) exposure to other viral communicable diseases: Secondary | ICD-10-CM | POA: Diagnosis not present

## 2019-03-27 DIAGNOSIS — E039 Hypothyroidism, unspecified: Secondary | ICD-10-CM | POA: Diagnosis not present

## 2019-06-04 ENCOUNTER — Ambulatory Visit (HOSPITAL_COMMUNITY): Admission: RE | Admit: 2019-06-04 | Payer: 59 | Source: Ambulatory Visit

## 2019-06-04 ENCOUNTER — Other Ambulatory Visit: Payer: Self-pay

## 2019-06-04 ENCOUNTER — Ambulatory Visit (HOSPITAL_COMMUNITY)
Admission: RE | Admit: 2019-06-04 | Discharge: 2019-06-04 | Disposition: A | Discharge status: Home / Self Care | Payer: 59 | Source: Ambulatory Visit | Attending: Oncology | Admitting: Oncology

## 2019-06-04 ENCOUNTER — Other Ambulatory Visit: Payer: Self-pay | Admitting: Oncology

## 2019-06-04 DIAGNOSIS — Z9889 Other specified postprocedural states: Secondary | ICD-10-CM | POA: Diagnosis not present

## 2019-06-04 DIAGNOSIS — D4989 Neoplasm of unspecified behavior of other specified sites: Secondary | ICD-10-CM | POA: Diagnosis not present

## 2019-06-04 MED ORDER — GADOBUTROL 1 MMOL/ML IV SOLN
7.5000 mL | Freq: Once | INTRAVENOUS | Status: AC | PRN
  Administered 2019-06-04: 7.5 mL via INTRAVENOUS

## 2019-06-05 ENCOUNTER — Telehealth: Payer: Self-pay | Admitting: *Deleted

## 2019-06-05 ENCOUNTER — Other Ambulatory Visit: Payer: Self-pay | Admitting: Oncology

## 2019-06-05 DIAGNOSIS — D4989 Neoplasm of unspecified behavior of other specified sites: Secondary | ICD-10-CM

## 2019-06-05 NOTE — Telephone Encounter (Signed)
Received call from [facility] to advise that pt's MRI results are available in Epic. Please review impressions.  Thank You

## 2019-06-05 NOTE — Telephone Encounter (Signed)
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  Thank you.

## 2019-06-07 ENCOUNTER — Other Ambulatory Visit: Payer: Self-pay | Admitting: Oncology

## 2019-06-12 ENCOUNTER — Ambulatory Visit (HOSPITAL_COMMUNITY)
Admission: RE | Admit: 2019-06-12 | Discharge: 2019-06-12 | Disposition: A | Discharge status: Home / Self Care | Payer: 59 | Source: Ambulatory Visit | Attending: Oncology | Admitting: Oncology

## 2019-06-12 ENCOUNTER — Other Ambulatory Visit: Payer: Self-pay | Admitting: Oncology

## 2019-06-12 ENCOUNTER — Other Ambulatory Visit: Payer: Self-pay

## 2019-06-12 DIAGNOSIS — D4989 Neoplasm of unspecified behavior of other specified sites: Secondary | ICD-10-CM

## 2019-06-12 DIAGNOSIS — Z85238 Personal history of other malignant neoplasm of thymus: Secondary | ICD-10-CM | POA: Diagnosis not present

## 2019-06-12 DIAGNOSIS — J9859 Other diseases of mediastinum, not elsewhere classified: Secondary | ICD-10-CM | POA: Insufficient documentation

## 2019-06-12 MED ORDER — SODIUM CHLORIDE (PF) 0.9 % IJ SOLN
INTRAMUSCULAR | Status: AC
  Filled 2019-06-12: qty 50

## 2019-06-12 MED ORDER — IOHEXOL 300 MG/ML  SOLN
75.0000 mL | Freq: Once | INTRAMUSCULAR | Status: AC | PRN
  Administered 2019-06-12: 07:00:00 75 mL via INTRAVENOUS

## 2019-06-12 NOTE — Progress Notes (Unsigned)
I discussed the CT scan results with Dr. Servando Snare and [name].  We are going to proceed to PET scanning to further elucidate this.  If the mass is hot I will proceed to biopsy.  Otherwise we will likely repeat a CT of the chest in 3 months.  Dr. Roxy Horseman is also presenting the case at thoracic conference

## 2019-06-14 ENCOUNTER — Other Ambulatory Visit: Payer: Self-pay | Admitting: *Deleted

## 2019-06-14 ENCOUNTER — Other Ambulatory Visit: Payer: Self-pay | Admitting: Oncology

## 2019-06-14 DIAGNOSIS — D4989 Neoplasm of unspecified behavior of other specified sites: Secondary | ICD-10-CM

## 2019-06-14 NOTE — Progress Notes (Signed)
The proposed treatment discussed in cancer conference 06/14/19 is for discussion purpose only and not a binding recommendation.  The patient was not physically examined nor present for their treatment options.  Therefore, final treatment plans cannot be decided.   

## 2019-06-14 NOTE — Progress Notes (Signed)
J's case was presented today at thoracic conference.  Careful review of the MRI and CT scan does not suggest a significant finding in that area.  The recommendation was made to discontinue the planned PET scan and repeat a CT of the chest in about 3 months.  Dr. Donald Prose and I have discussed this.  I have discussed it with [name].  The plan is for a CT of the chest to be done the first week in December with fine cuts through the area in question.

## 2019-06-18 ENCOUNTER — Telehealth: Payer: Self-pay | Admitting: Cardiothoracic Surgery

## 2019-06-18 NOTE — Telephone Encounter (Signed)
After the multidisciplinary thoracic oncology conference this morning where the patient's MRIs and recent CT scan was reviewed and discussed with pathology, medical oncology, and specifically another reviewed by radiology.  The consensus was that the findings both on the MRI and CT scan were very vague and poorly defined, not suggesting definite tumor recurrence.  The consensus was to repeat the CT scan in 3 to 4 months.  I reviewed with the patient the discussion at the [facility] conference and also discussed with Dr. Jana Hakim.  Dr. Jana Hakim has placed an order for repeat CT scan and the patient is agreeable with this.

## 2019-07-31 ENCOUNTER — Ambulatory Visit (HOSPITAL_COMMUNITY)
Admission: RE | Admit: 2019-07-31 | Discharge: 2019-07-31 | Disposition: A | Discharge status: Home / Self Care | Payer: 59 | Source: Ambulatory Visit | Attending: Oncology | Admitting: Oncology

## 2019-07-31 ENCOUNTER — Other Ambulatory Visit: Payer: Self-pay

## 2019-07-31 ENCOUNTER — Other Ambulatory Visit: Payer: Self-pay | Admitting: Oncology

## 2019-07-31 DIAGNOSIS — D4989 Neoplasm of unspecified behavior of other specified sites: Secondary | ICD-10-CM | POA: Insufficient documentation

## 2019-07-31 DIAGNOSIS — C37 Malignant neoplasm of thymus: Secondary | ICD-10-CM | POA: Diagnosis not present

## 2019-07-31 DIAGNOSIS — D150 Benign neoplasm of thymus: Secondary | ICD-10-CM | POA: Diagnosis not present

## 2019-07-31 MED ORDER — IOHEXOL 300 MG/ML  SOLN
75.0000 mL | Freq: Once | INTRAMUSCULAR | Status: AC | PRN
  Administered 2019-07-31: 11:00:00 75 mL via INTRAVENOUS

## 2019-07-31 MED ORDER — SODIUM CHLORIDE (PF) 0.9 % IJ SOLN
INTRAMUSCULAR | Status: AC
  Filled 2019-07-31: qty 50

## 2019-08-02 ENCOUNTER — Other Ambulatory Visit: Payer: Self-pay | Admitting: *Deleted

## 2019-08-02 NOTE — Progress Notes (Signed)
The proposed treatment discussed in cancer conference 08/02/2019 is for discussion purpose only and is not a binding recommendation.  The patient was not physically examined nor present for their treatment options.  Therefore, final treatment plans cannot be decided.  

## 2019-08-03 ENCOUNTER — Other Ambulatory Visit: Payer: Self-pay | Admitting: Oncology

## 2019-08-03 DIAGNOSIS — J9859 Other diseases of mediastinum, not elsewhere classified: Secondary | ICD-10-CM

## 2019-08-03 DIAGNOSIS — D4989 Neoplasm of unspecified behavior of other specified sites: Secondary | ICD-10-CM

## 2019-08-03 NOTE — Progress Notes (Signed)
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 Servando Snare.  At this point, with a repeat CT scan of the chest not giving [place] the answer, we are proceeding to PET scanning.  This will let us know whether we can continue to follow the mass in question or whether we need to proceed to biopsy.

## 2019-08-06 ENCOUNTER — Other Ambulatory Visit: Payer: Self-pay | Admitting: *Deleted

## 2019-08-08 ENCOUNTER — Other Ambulatory Visit: Payer: Self-pay | Admitting: Oncology

## 2019-08-08 ENCOUNTER — Encounter (HOSPITAL_COMMUNITY)
Admission: RE | Admit: 2019-08-08 | Discharge: 2019-08-08 | Disposition: A | Discharge status: Home / Self Care | Payer: 59 | Source: Ambulatory Visit | Attending: Oncology | Admitting: Oncology

## 2019-08-08 ENCOUNTER — Encounter (HOSPITAL_COMMUNITY): Payer: Self-pay

## 2019-08-08 ENCOUNTER — Other Ambulatory Visit: Payer: Self-pay

## 2019-08-08 DIAGNOSIS — D4989 Neoplasm of unspecified behavior of other specified sites: Secondary | ICD-10-CM | POA: Insufficient documentation

## 2019-08-08 DIAGNOSIS — J9859 Other diseases of mediastinum, not elsewhere classified: Secondary | ICD-10-CM

## 2019-08-08 LAB — GLUCOSE, CAPILLARY: Glucose-Capillary: 97 mg/dL (ref 70–99)

## 2019-08-08 MED ORDER — FLUDEOXYGLUCOSE F - 18 (FDG) INJECTION
7.7000 | Freq: Once | INTRAVENOUS | Status: AC
  Administered 2019-08-08: 7.7 via INTRAVENOUS

## 2019-08-13 ENCOUNTER — Encounter (HOSPITAL_COMMUNITY)
Admission: RE | Admit: 2019-08-13 | Discharge: 2019-08-13 | Disposition: A | Discharge status: Home / Self Care | Payer: 59 | Source: Ambulatory Visit | Attending: Oncology | Admitting: Oncology

## 2019-08-13 ENCOUNTER — Other Ambulatory Visit: Payer: Self-pay

## 2019-08-13 DIAGNOSIS — R5383 Other fatigue: Secondary | ICD-10-CM | POA: Diagnosis not present

## 2019-08-13 DIAGNOSIS — J9859 Other diseases of mediastinum, not elsewhere classified: Secondary | ICD-10-CM | POA: Diagnosis not present

## 2019-08-13 DIAGNOSIS — D4989 Neoplasm of unspecified behavior of other specified sites: Secondary | ICD-10-CM | POA: Diagnosis not present

## 2019-08-13 DIAGNOSIS — J3489 Other specified disorders of nose and nasal sinuses: Secondary | ICD-10-CM | POA: Diagnosis not present

## 2019-08-13 DIAGNOSIS — R5381 Other malaise: Secondary | ICD-10-CM | POA: Diagnosis not present

## 2019-08-13 DIAGNOSIS — J029 Acute pharyngitis, unspecified: Secondary | ICD-10-CM | POA: Diagnosis not present

## 2019-08-13 DIAGNOSIS — R05 Cough: Secondary | ICD-10-CM | POA: Diagnosis not present

## 2019-08-13 DIAGNOSIS — R948 Abnormal results of function studies of other organs and systems: Secondary | ICD-10-CM | POA: Diagnosis not present

## 2019-08-13 DIAGNOSIS — Z20828 Contact with and (suspected) exposure to other viral communicable diseases: Secondary | ICD-10-CM | POA: Diagnosis not present

## 2019-08-13 LAB — GLUCOSE, CAPILLARY: Glucose-Capillary: 92 mg/dL (ref 70–99)

## 2019-08-13 MED ORDER — FLUDEOXYGLUCOSE F - 18 (FDG) INJECTION
7.1000 | Freq: Once | INTRAVENOUS | Status: AC | PRN
  Administered 2019-08-13: 7.1 via INTRAVENOUS

## 2020-05-14 MED FILL — FLUCONAZOLE 150 MG TABS: 150 | 28 days supply | Qty: 4 | Fill #0

## 2020-07-04 ENCOUNTER — Other Ambulatory Visit: Payer: Self-pay | Admitting: Oncology

## 2020-07-04 DIAGNOSIS — D4989 Neoplasm of unspecified behavior of other specified sites: Secondary | ICD-10-CM

## 2020-07-21 ENCOUNTER — Ambulatory Visit (HOSPITAL_COMMUNITY): Payer: 59

## 2020-07-22 ENCOUNTER — Ambulatory Visit (HOSPITAL_COMMUNITY)
Admission: RE | Admit: 2020-07-22 | Discharge: 2020-07-22 | Disposition: A | Discharge status: Home / Self Care | Payer: 59 | Source: Ambulatory Visit | Attending: Oncology | Admitting: Oncology

## 2020-07-22 DIAGNOSIS — D150 Benign neoplasm of thymus: Secondary | ICD-10-CM | POA: Diagnosis not present

## 2020-07-22 DIAGNOSIS — D4989 Neoplasm of unspecified behavior of other specified sites: Secondary | ICD-10-CM | POA: Diagnosis not present

## 2020-07-22 MED ORDER — IOHEXOL 300 MG/ML  SOLN
75.0000 mL | Freq: Once | INTRAMUSCULAR | Status: AC | PRN
  Administered 2020-07-22: 75 mL via INTRAVENOUS

## 2020-07-29 ENCOUNTER — Ambulatory Visit (HOSPITAL_COMMUNITY): Payer: 59

## 2020-12-23 DIAGNOSIS — H43823 Vitreomacular adhesion, bilateral: Secondary | ICD-10-CM | POA: Diagnosis not present

## 2020-12-23 DIAGNOSIS — H35361 Drusen (degenerative) of macula, right eye: Secondary | ICD-10-CM | POA: Diagnosis not present

## 2020-12-23 DIAGNOSIS — H21341 Primary cyst of pars plana, right eye: Secondary | ICD-10-CM | POA: Diagnosis not present

## 2020-12-23 DIAGNOSIS — H35711 Central serous chorioretinopathy, right eye: Secondary | ICD-10-CM | POA: Diagnosis not present

## 2021-01-20 ENCOUNTER — Other Ambulatory Visit: Payer: Self-pay | Admitting: Oncology

## 2021-01-22 ENCOUNTER — Other Ambulatory Visit: Payer: Self-pay | Admitting: Oncology

## 2021-01-22 DIAGNOSIS — D4989 Neoplasm of unspecified behavior of other specified sites: Secondary | ICD-10-CM

## 2021-01-22 NOTE — Progress Notes (Unsigned)
From Dr Elvena Oyer's note 07/11/2013:  45 y.o.  [name] presenting with shortness of breath, CT scan showing an anterior mediastinal mass, with normal beta hCG and alpha-fetoprotein  (1) status post median sternotomy November 7 for a pT2 pN0, stage II thymoma, grade 2b, with positive margins  PLAN: As discussed above, several questions remain to be clarified from the pathology report, which I have discussed personally with Dr. Donato Heinz and Dr. Gari Crown. It may be worthwhile to seek a second pathologic opinion, given the rarity of these tumors, and [name] is very much in favor of that. His case is going to be presented tomorrow at the thoracic conference, to see if there is a consensus whether or not he will need adjuvant radiation therapy.  To review: NCCN guidelines suggest consideration of XRT for stage II thymomas, but  My understanding is that for stage II thymomas radiation should be considered after resection with no residual tumor, but recommend XRT if microscopic residual tumor is present--specifically a dose of 54 gray is recommended for microscopically positive resection margins.   From Dr Charlton Amor 07/19/2013 note:   [name] is a 45 y.o. male who is seen today for the courtesy of Dr. Jana Hakim for evaluation of his thymoma. He presented with a recent history of chest discomfort and dyspnea. In the ER a D-dimer was slightly elevated leading to a CT angiogram of the chest on 07/02/2013. There was no evidence of pulmonary embolism. He was found to have an anterior superior mediastinal mass measuring 3.7 x 5.9 x 4.8 cm. There is no adenopathy or evidence for pleural or pericardial effusions. The patient was seen by Dr. Servando Snare who performed a partial sternotomy with resection of what was felt represents a thymoma. Dr. Servando Snare noted that there was adherence of tumor along the right pleural surface, but this was completely excised. He felt that he had a complete resection.  There is no evidence for invasion into the pericardium, phrenic nerve, or vascular structures. A less than 1 cm lymph node was identified and felt to represent benign lymphoid tissue. He did well postoperatively. On review of his pathology, which was read by Dr. Donato Heinz, he was found to have a type B2 thymoma with tumor extending to and focally to broadly involving a surgical margin. The tumor measures 6.7 x 6.0 x 3.0 cm. There was felt to be transcapsular  tumor extension (where the capsule was present). Dr. Servando Snare and I spoke with Dr. Donato Heinz , and we could not reconcile his report with the intraoperative findings. Pathology was sent for outside review. He was presented last week at the thoracic oncology conference, and again Dr. Servando Snare felt that he had a complete resection.  IMPRESSION: Stage IIB (T2 N0) thymoma, histologic type B2. On review of his pathology, there is transcapsular tumor extension with an involved surgical margin. This margin is probably along the right pleural surface. Dr. Servando Snare tried to review the gross pathology with Dr. Donato Heinz, but it is now difficult to assess whether not the patient truly had a positive margin. Histologically, we rely quite heavily on the surgeon's interpretation as to the local aggressiveness of a thymoma. If the tumor is felt to be encapsulated and completely resected, we do not offer her adjuvant radiation therapy. I explained to the patient and his wife that we have a dilemma in that the patient's pathology does not correlate with Dr. Everrett Coombe intraoperative findings. Furthermore, based on the NCCN guidelines the role of radiation therapy even for  a positive margin is 2B (based on low-level evidence by consensus that intervention is appropriate). I do not recommend postoperative radiation therapy based on the operative findings. He can be followed by CT or MR at regular intervals. I defer to Dr. Servando Snare as to the frequency of surveillance scanning. The patient was  informed that in the unlikely event that he develops a local recurrence, we would typically favor surgical resection and then radiation therapy at that time. Dose delivered for potential microscopic disease is less than the dose delivered for gross disease. The patient and his wife are comfortable with this approach.  From Dr Charlton Amor note 07/23/2013:  Chart note: The outside second opinion pathology report from the [facility] is in agreement with the observations made by Dr. Donato Heinz. In addition,there appears to be a minor  B3 component indicating perhaps a more aggressive thymoma. Dr. Servando Snare operative report photograph does show attached pleura. Dr. Servando Snare does not feel that the ink seen on the cauterized margin represents the true margin of the thymoma. Again, Dr. Servando Snare feels that he had a clean resection, and thus there would be no significant benefit for postoperative radiation therapy. Dr. Servando Snare and I reviewed and discussed this report in detail.

## 2021-02-17 ENCOUNTER — Other Ambulatory Visit: Payer: Self-pay | Admitting: Adult Health

## 2021-02-17 NOTE — Progress Notes (Signed)
Updated orders at radiology request to reflect expected CT scan date.  Wilber Bihari, NP

## 2021-03-23 ENCOUNTER — Other Ambulatory Visit: Payer: Self-pay | Admitting: Oncology

## 2021-04-23 DIAGNOSIS — Z0000 Encounter for general adult medical examination without abnormal findings: Secondary | ICD-10-CM | POA: Diagnosis not present

## 2021-04-23 DIAGNOSIS — R82998 Other abnormal findings in urine: Secondary | ICD-10-CM | POA: Diagnosis not present

## 2021-04-24 ENCOUNTER — Other Ambulatory Visit (HOSPITAL_COMMUNITY): Payer: Self-pay

## 2021-04-24 DIAGNOSIS — M533 Sacrococcygeal disorders, not elsewhere classified: Secondary | ICD-10-CM | POA: Diagnosis not present

## 2021-04-24 DIAGNOSIS — Z0000 Encounter for general adult medical examination without abnormal findings: Secondary | ICD-10-CM | POA: Diagnosis not present

## 2021-04-24 DIAGNOSIS — D4989 Neoplasm of unspecified behavior of other specified sites: Secondary | ICD-10-CM | POA: Diagnosis not present

## 2021-04-24 MED ORDER — FLUCONAZOLE 150 MG PO TABS
150.0000 mg | ORAL_TABLET | Freq: Every day | ORAL | 6 refills | Status: DC
  Filled 2021-04-24: qty 3, 3d supply, fill #0
  Filled 2022-02-23: qty 3, 3d supply, fill #1

## 2021-04-30 ENCOUNTER — Other Ambulatory Visit (HOSPITAL_COMMUNITY): Payer: Self-pay | Admitting: Orthopedic Surgery

## 2021-04-30 ENCOUNTER — Other Ambulatory Visit (HOSPITAL_COMMUNITY): Payer: Self-pay

## 2021-04-30 ENCOUNTER — Other Ambulatory Visit: Payer: Self-pay | Admitting: Orthopedic Surgery

## 2021-04-30 DIAGNOSIS — M545 Low back pain, unspecified: Secondary | ICD-10-CM

## 2021-05-01 ENCOUNTER — Other Ambulatory Visit: Payer: Self-pay

## 2021-05-01 ENCOUNTER — Ambulatory Visit (HOSPITAL_COMMUNITY)
Admission: RE | Admit: 2021-05-01 | Discharge: 2021-05-01 | Disposition: A | Discharge status: Home / Self Care | Payer: 59 | Source: Ambulatory Visit | Attending: Orthopedic Surgery | Admitting: Orthopedic Surgery

## 2021-05-01 DIAGNOSIS — M545 Low back pain, unspecified: Secondary | ICD-10-CM | POA: Diagnosis not present

## 2021-05-08 DIAGNOSIS — M533 Sacrococcygeal disorders, not elsewhere classified: Secondary | ICD-10-CM | POA: Diagnosis not present

## 2021-05-11 ENCOUNTER — Other Ambulatory Visit: Payer: Self-pay | Admitting: Orthopedic Surgery

## 2021-05-11 DIAGNOSIS — G8929 Other chronic pain: Secondary | ICD-10-CM

## 2021-05-11 DIAGNOSIS — M545 Low back pain, unspecified: Secondary | ICD-10-CM

## 2021-06-03 ENCOUNTER — Other Ambulatory Visit: Payer: 59

## 2021-06-10 ENCOUNTER — Other Ambulatory Visit: Payer: Self-pay | Admitting: Oncology

## 2021-06-10 DIAGNOSIS — D4989 Neoplasm of unspecified behavior of other specified sites: Secondary | ICD-10-CM

## 2021-06-10 DIAGNOSIS — J9859 Other diseases of mediastinum, not elsewhere classified: Secondary | ICD-10-CM

## 2021-06-30 ENCOUNTER — Other Ambulatory Visit (HOSPITAL_COMMUNITY): Payer: Self-pay

## 2021-06-30 DIAGNOSIS — R300 Dysuria: Secondary | ICD-10-CM | POA: Diagnosis not present

## 2021-06-30 MED ORDER — CARESTART COVID-19 HOME TEST VI KIT
PACK | 0 refills | Status: DC
  Filled 2021-06-30: qty 2, 2d supply, fill #0

## 2021-07-02 ENCOUNTER — Other Ambulatory Visit: Payer: Self-pay

## 2021-07-02 DIAGNOSIS — J9859 Other diseases of mediastinum, not elsewhere classified: Secondary | ICD-10-CM

## 2021-07-02 NOTE — Progress Notes (Signed)
Order placed for CT chest to GI per pt request. Called GI and they state they will call pt to schedule. Pt is aware and verbalized thanks.

## 2021-07-03 ENCOUNTER — Other Ambulatory Visit: Payer: Self-pay | Admitting: Endocrinology

## 2021-07-03 ENCOUNTER — Ambulatory Visit (HOSPITAL_BASED_OUTPATIENT_CLINIC_OR_DEPARTMENT_OTHER): Payer: 59

## 2021-07-03 ENCOUNTER — Other Ambulatory Visit (HOSPITAL_COMMUNITY): Payer: Self-pay

## 2021-07-03 ENCOUNTER — Other Ambulatory Visit (HOSPITAL_COMMUNITY): Payer: Self-pay | Admitting: Endocrinology

## 2021-07-03 ENCOUNTER — Encounter (HOSPITAL_BASED_OUTPATIENT_CLINIC_OR_DEPARTMENT_OTHER): Payer: Self-pay

## 2021-07-03 ENCOUNTER — Other Ambulatory Visit (HOSPITAL_BASED_OUTPATIENT_CLINIC_OR_DEPARTMENT_OTHER): Payer: Self-pay | Admitting: Endocrinology

## 2021-07-03 DIAGNOSIS — R10A0 Flank pain, unspecified side: Secondary | ICD-10-CM

## 2021-07-03 DIAGNOSIS — R109 Unspecified abdominal pain: Secondary | ICD-10-CM

## 2021-07-03 MED ORDER — TAMSULOSIN HCL 0.4 MG PO CAPS
0.4000 mg | ORAL_CAPSULE | Freq: Every day | ORAL | 0 refills | Status: DC
  Filled 2021-07-03: qty 7, 7d supply, fill #0

## 2021-07-07 ENCOUNTER — Other Ambulatory Visit: Payer: Self-pay

## 2021-07-07 ENCOUNTER — Ambulatory Visit
Admission: RE | Admit: 2021-07-07 | Discharge: 2021-07-07 | Disposition: A | Discharge status: Home / Self Care | Payer: 59 | Source: Ambulatory Visit | Attending: Endocrinology | Admitting: Endocrinology

## 2021-07-07 ENCOUNTER — Other Ambulatory Visit: Payer: Self-pay | Admitting: Endocrinology

## 2021-07-07 DIAGNOSIS — R109 Unspecified abdominal pain: Secondary | ICD-10-CM

## 2021-07-08 ENCOUNTER — Other Ambulatory Visit (HOSPITAL_COMMUNITY): Payer: Self-pay

## 2021-07-08 MED ORDER — CIPROFLOXACIN HCL 500 MG PO TABS
500.0000 mg | ORAL_TABLET | Freq: Two times a day (BID) | ORAL | 0 refills | Status: DC
  Filled 2021-07-08: qty 6, 3d supply, fill #0

## 2021-07-30 ENCOUNTER — Ambulatory Visit
Admission: RE | Admit: 2021-07-30 | Discharge: 2021-07-30 | Disposition: A | Discharge status: Home / Self Care | Payer: 59 | Source: Ambulatory Visit | Attending: Oncology | Admitting: Oncology

## 2021-07-30 ENCOUNTER — Encounter: Payer: Self-pay | Admitting: Oncology

## 2021-07-30 DIAGNOSIS — Z85238 Personal history of other malignant neoplasm of thymus: Secondary | ICD-10-CM | POA: Diagnosis not present

## 2021-07-30 DIAGNOSIS — J9859 Other diseases of mediastinum, not elsewhere classified: Secondary | ICD-10-CM

## 2021-07-30 MED ORDER — IOPAMIDOL (ISOVUE-300) INJECTION 61%
75.0000 mL | Freq: Once | INTRAVENOUS | Status: AC | PRN
  Administered 2021-07-30: 75 mL via INTRAVENOUS

## 2021-09-07 ENCOUNTER — Other Ambulatory Visit (HOSPITAL_COMMUNITY): Payer: Self-pay

## 2021-09-07 MED ORDER — OSELTAMIVIR PHOSPHATE 75 MG PO CAPS
75.0000 mg | ORAL_CAPSULE | Freq: Every day | ORAL | 0 refills | Status: DC
  Filled 2021-09-07: qty 10, 10d supply, fill #0
  Filled 2021-09-07: qty 7, 7d supply, fill #0

## 2022-02-23 ENCOUNTER — Other Ambulatory Visit (HOSPITAL_COMMUNITY): Payer: Self-pay

## 2022-04-26 DIAGNOSIS — Z0000 Encounter for general adult medical examination without abnormal findings: Secondary | ICD-10-CM | POA: Diagnosis not present

## 2022-04-26 DIAGNOSIS — Z1339 Encounter for screening examination for other mental health and behavioral disorders: Secondary | ICD-10-CM | POA: Diagnosis not present

## 2022-04-26 DIAGNOSIS — D4989 Neoplasm of unspecified behavior of other specified sites: Secondary | ICD-10-CM | POA: Diagnosis not present

## 2022-04-26 DIAGNOSIS — E039 Hypothyroidism, unspecified: Secondary | ICD-10-CM | POA: Diagnosis not present

## 2022-04-26 DIAGNOSIS — M533 Sacrococcygeal disorders, not elsewhere classified: Secondary | ICD-10-CM | POA: Diagnosis not present

## 2022-04-28 ENCOUNTER — Other Ambulatory Visit (HOSPITAL_COMMUNITY): Payer: Self-pay

## 2022-05-06 ENCOUNTER — Other Ambulatory Visit (HOSPITAL_COMMUNITY): Payer: Self-pay

## 2022-05-18 ENCOUNTER — Other Ambulatory Visit: Payer: Self-pay | Admitting: *Deleted

## 2022-05-18 DIAGNOSIS — D4989 Neoplasm of unspecified behavior of other specified sites: Secondary | ICD-10-CM

## 2022-05-25 ENCOUNTER — Other Ambulatory Visit (HOSPITAL_COMMUNITY): Payer: Self-pay

## 2022-05-25 MED ORDER — VITAMIN D3 1.25 MG (50000 UT) PO CAPS
1.0000 | ORAL_CAPSULE | ORAL | 5 refills | Status: AC
  Filled 2022-05-25 – 2022-06-08 (×2): qty 12, 84d supply, fill #0
  Filled 2022-08-26: qty 12, 84d supply, fill #1

## 2022-06-04 ENCOUNTER — Other Ambulatory Visit (HOSPITAL_COMMUNITY): Payer: Self-pay

## 2022-06-08 ENCOUNTER — Ambulatory Visit
Admission: RE | Admit: 2022-06-08 | Discharge: 2022-06-08 | Disposition: A | Discharge status: Home / Self Care | Payer: 59 | Source: Ambulatory Visit | Attending: Thoracic Surgery (Cardiothoracic Vascular Surgery) | Admitting: Thoracic Surgery (Cardiothoracic Vascular Surgery)

## 2022-06-08 ENCOUNTER — Other Ambulatory Visit (HOSPITAL_COMMUNITY): Payer: Self-pay

## 2022-06-08 ENCOUNTER — Other Ambulatory Visit: Payer: Self-pay | Admitting: *Deleted

## 2022-06-08 ENCOUNTER — Encounter: Payer: Self-pay | Admitting: Thoracic Surgery (Cardiothoracic Vascular Surgery)

## 2022-06-08 ENCOUNTER — Ambulatory Visit (INDEPENDENT_AMBULATORY_CARE_PROVIDER_SITE_OTHER): Payer: 59 | Admitting: Thoracic Surgery (Cardiothoracic Vascular Surgery)

## 2022-06-08 VITALS — BP 112/71 | HR 49 | Resp 20 | Ht 66.0 in | Wt 148.1 lb

## 2022-06-08 DIAGNOSIS — D4989 Neoplasm of unspecified behavior of other specified sites: Secondary | ICD-10-CM

## 2022-06-08 DIAGNOSIS — I700 Atherosclerosis of aorta: Secondary | ICD-10-CM | POA: Diagnosis not present

## 2022-06-08 DIAGNOSIS — C37 Malignant neoplasm of thymus: Secondary | ICD-10-CM | POA: Diagnosis not present

## 2022-06-08 DIAGNOSIS — N62 Hypertrophy of breast: Secondary | ICD-10-CM | POA: Diagnosis not present

## 2022-06-08 DIAGNOSIS — Z87898 Personal history of other specified conditions: Secondary | ICD-10-CM

## 2022-06-08 DIAGNOSIS — R918 Other nonspecific abnormal finding of lung field: Secondary | ICD-10-CM | POA: Diagnosis not present

## 2022-06-08 DIAGNOSIS — R222 Localized swelling, mass and lump, trunk: Secondary | ICD-10-CM

## 2022-06-08 MED ORDER — IOPAMIDOL (ISOVUE-300) INJECTION 61%
75.0000 mL | Freq: Once | INTRAVENOUS | Status: AC | PRN
  Administered 2022-06-08: 75 mL via INTRAVENOUS

## 2022-06-08 NOTE — Progress Notes (Signed)
[address]       [place] [address]             [phone]    HPI: [name] presents for follow-up regarding a resected thymoma.  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  This was a type B2 thymoma which measured 6.7 x 6.0 x 3.0 cm.  The tumor did extend to the margin.  He opted not to have adjuvant radiation.  As Dr. Servando Snare and Dr. Jana Hakim have both retired he switched his follow-up to me.  He has been feeling well.  He has no complaints.  CT was done just prior to his office visit.  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 12 capsule 5   No current facility-administered medications for this visit.    Physical Exam BP 112/71 (BP Location: Left Arm, Patient Position: Sitting, Cuff Size: Normal)   Pulse (!) 49   Resp 20   Ht '5\' 6"'$  (1.676 m)   Wt 148 lb 1.9 oz (67.2 kg)   SpO2 100% Comment: RA  BMI 23.50 kg/m  46 year old male in no acute distress Well-developed and well-nourished Alert and oriented  Diagnostic Tests: CT CHEST WITH CONTRAST   TECHNIQUE: Multidetector CT imaging of the chest was performed during intravenous contrast administration.   RADIATION DOSE REDUCTION: This exam was performed according to the departmental dose-optimization program which includes automated exposure control, adjustment of the mA and/or kV according to patient size and/or use of iterative reconstruction technique.   CONTRAST:  16m ISOVUE-300 IOPAMIDOL (ISOVUE-300) INJECTION 61%   COMPARISON:  07/30/2021, 07/22/2020,  08/13/2019   FINDINGS: Cardiovascular: Aortic atherosclerosis. Normal heart size. No pericardial effusion.   Mediastinum/Nodes: No enlarged mediastinal, hilar, or axillary lymph nodes. Status post median sternotomy. No evidence of recurrent mass or suspicious soft tissue in the anterior mediastinum (series 2, image 49). Thyroid gland, trachea, and esophagus demonstrate no significant findings.   Lungs/Pleura: Lungs are clear. No pleural effusion or pneumothorax. Slight interval enlargement of an oval pleural-based mass of the posterior right lower lobe between the ninth and tenth ribs as well as between the tenth and eleventh ribs. The larger of these two lesions, superiorly, measures 2.1 x 1.0 cm, previously 1.8 x 0.8 cm, and further these of very slowly enlarged over multiple prior examinations (series 2, image 95, 112).   Upper Abdomen: No acute abnormality.   Musculoskeletal: Bilateral gynecomastia.  No acute osseous findings.   IMPRESSION: 1. Status post median sternotomy. No evidence of recurrent mass or suspicious soft tissue in the anterior mediastinum. 2. However, there has been slight interval enlargement of oval pleural-based masses of the posterior right lower lobe between the ninth and tenth ribs as well as between the tenth and eleventh ribs. These have slowly enlarged over multiple prior examinations and are suspicious for pleural metastases in the setting of thymoma. Consider PET-CT metabolic characterization and tissue sampling.   Aortic Atherosclerosis (ICD10-I70.0).     Electronically Signed  By: Delanna Ahmadi M.D.   On: 06/08/2022 14:43 I personally reviewed the CT images.  There are 2 relatively smooth oval pleural-based masses in the right chest posteriorly along the chest wall.  Enlarged over previous scans dating back to November 2021.  Impression: Greycen Felter is a 46 year old gentleman with a history of a thymoma resected about 9 years ago.  He has  been followed since that time.  CT today showed pleural-based masses.  It is unclear if these are lung or chest wall based, although I favor them being chest wall based.  They are very smooth and posterior.  If not for his history of thymoma I would be unimpressed given their smooth nature and very slow growth.  Differential diagnosis includes metastatic thymoma versus schwannomas.  We discussed continued radiographic follow-up.  Given the increase in size I do not think is a good idea.  We discussed the option of proceeding with surgical resection versus further characterization with PET/CT.  He wishes to have a PET/CT before deciding on any additional intervention.  Plan: PET/CT to further assess the pleural-based masses.  [name], MD [facility] [phone]

## 2022-06-18 ENCOUNTER — Encounter (HOSPITAL_COMMUNITY): Payer: 59

## 2022-06-21 ENCOUNTER — Ambulatory Visit (HOSPITAL_COMMUNITY): Payer: 59

## 2022-06-22 ENCOUNTER — Ambulatory Visit (INDEPENDENT_AMBULATORY_CARE_PROVIDER_SITE_OTHER): Payer: 59 | Admitting: Thoracic Surgery (Cardiothoracic Vascular Surgery)

## 2022-06-22 ENCOUNTER — Ambulatory Visit
Admission: RE | Admit: 2022-06-22 | Discharge: 2022-06-22 | Disposition: A | Discharge status: Home / Self Care | Payer: 59 | Source: Ambulatory Visit | Attending: Thoracic Surgery (Cardiothoracic Vascular Surgery) | Admitting: Thoracic Surgery (Cardiothoracic Vascular Surgery)

## 2022-06-22 ENCOUNTER — Encounter: Payer: 59 | Admitting: Thoracic Surgery (Cardiothoracic Vascular Surgery)

## 2022-06-22 VITALS — BP 124/74 | HR 64 | Resp 20 | Ht 66.0 in | Wt 148.0 lb

## 2022-06-22 DIAGNOSIS — D4989 Neoplasm of unspecified behavior of other specified sites: Secondary | ICD-10-CM | POA: Insufficient documentation

## 2022-06-22 DIAGNOSIS — M489 Spondylopathy, unspecified: Secondary | ICD-10-CM | POA: Insufficient documentation

## 2022-06-22 DIAGNOSIS — C37 Malignant neoplasm of thymus: Secondary | ICD-10-CM | POA: Diagnosis not present

## 2022-06-22 DIAGNOSIS — I700 Atherosclerosis of aorta: Secondary | ICD-10-CM | POA: Diagnosis not present

## 2022-06-22 DIAGNOSIS — R911 Solitary pulmonary nodule: Secondary | ICD-10-CM | POA: Diagnosis not present

## 2022-06-22 DIAGNOSIS — Z87898 Personal history of other specified conditions: Secondary | ICD-10-CM

## 2022-06-22 DIAGNOSIS — R918 Other nonspecific abnormal finding of lung field: Secondary | ICD-10-CM | POA: Diagnosis not present

## 2022-06-22 DIAGNOSIS — R222 Localized swelling, mass and lump, trunk: Secondary | ICD-10-CM

## 2022-06-22 LAB — GLUCOSE, CAPILLARY: Glucose-Capillary: 107 mg/dL — ABNORMAL HIGH (ref 70–99)

## 2022-06-22 MED ORDER — FLUDEOXYGLUCOSE F - 18 (FDG) INJECTION
7.7000 | Freq: Once | INTRAVENOUS | Status: AC | PRN
  Administered 2022-06-22: 8.47 via INTRAVENOUS

## 2022-06-22 NOTE — Progress Notes (Signed)
[address]       [place] [address]             [phone]     HPI: Dr. [name] returns to discuss results of his PET/CT.  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  He had a resection by Dr. Servando [name] in 2014.  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  He did not receive any adjuvant therapy.  After Dr. Servando [name] and Dr. Jana [name] retired he switches follow-up here to me.  I saw him a couple of weeks ago with an annual CT scan.  There were 2 pleural-based masses in the lower right chest.  Those have been present on the scan a year prior but had not been mentioned.  There had been some interval growth.  He went to have a PET/CT and now returns along with his wife to discuss treatment options.   Current Outpatient Medications  Medication Sig Dispense Refill   Cholecalciferol (VITAMIN D3) 1.25 MG (50000 UT) CAPS Take 1 capsule by mouth once a week. 12 capsule 5   No current facility-administered medications for this visit.    Physical Exam BP 124/74   Pulse 64   Resp 20   Ht '5\' 6"'$  (1.676 m)   Wt 148 lb (67.1 kg)   SpO2 97% Comment: RA  BMI 23.89 kg/m  Well-appearing 46 year old man in no acute distress  Diagnostic Tests: NUCLEAR MEDICINE PET SKULL BASE TO THIGH   TECHNIQUE: 8.52 mCi F-18 FDG was injected intravenously. Full-ring PET imaging was performed from the skull base to thigh after the radiotracer. CT data was obtained and used for attenuation correction and anatomic localization.   Fasting blood glucose: 107 mg/dl   COMPARISON:  CT chest 06/08/2022 and PET-CT 08/13/2019   FINDINGS: Mediastinal blood pool activity: SUV max 1.60   Liver activity: SUV max NA   NECK: No hypermetabolic lymph nodes in the neck.   Incidental CT findings: None   CHEST: No tracer avid axillary, supraclavicular, mediastinal, or hilar lymph nodes.   No signs of recurrent tracer avid tumor within the anterior mediastinum.    No tracer avid pulmonary nodule or mass.   The pleural base soft tissue nodule overlying the posteromedial right lower lobe measures 2.2 x 0.7 cm with SUV max of 1.63, image 110/2. In retrospect this was present on the CT from 07/30/2021 measuring 1.8 x 0.7 cm.   Second area of pleural nodularity is noted at a slightly more caudal level measuring 1.6 x 0.6 cm with SUV max 1.37. On 07/30/2021 this measured approximately 1.5 x 0.5 cm.   Incidental CT findings: No pleural effusion identified. Aortic atherosclerotic calcifications.   ABDOMEN/PELVIS: No abnormal hypermetabolic activity within the liver, pancreas, adrenal glands, or spleen. No hypermetabolic lymph nodes in the abdomen or pelvis.   Incidental CT findings: None.   SKELETON: No focal hypermetabolic activity to suggest skeletal metastasis.   Incidental CT findings: None.   IMPRESSION: 1. No signs of tracer avid tumor recurrence within the anterior mediastinum. 2. There are 2 pleural-based soft tissue nodules overlying the posteromedial right lower lobe which slightly increased when compared with 07/30/2021. There is mild tracer uptake associated with these lesions with SUV max of 1.63 and 1.37. Etiology indeterminate. However, given the progression compared with the original PET-CT from 08/13/2019 continued interval surveillance versus biopsy is advised. 3. Aortic Atherosclerosis (ICD10-I70.0).  Electronically Signed   By: [name] M.D.   On: 06/22/2022 11:39 I personally reviewed the PET/CT images.  There is activity in the pleural-based soft tissue nodules.  Relatively low-grade.  Neither rules in or rules out recurrent thymoma versus other potential etiologies, i.e. schwannoma.  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  He has been followed since then with no evidence of recurrent disease.  An annual CT for follow-up was done  a few weeks ago and showed 2 pleural-based masses in the right chest posteriorly.  These actually were present a year prior but were not mentioned in the report.  They were not present in 2021.  There was a slight increase in size over the 10 months between the scans.  Therefore I recommended a PET/CT.  PET/CT images were reviewed with Dr. [name] and his wife.  There is mild uptake.  The findings are definitive one way or the other.  While I still think this is possible to be an alternative etiology, I cannot rule out the possibility of recurrence.  By the same token this would be a pretty unusual pattern of recurrence.  We discussed 3 potential options.  One would be continued radiographic follow-up.  Since there was interval growth and there is activity on PET I would not be in favor that option.  The other 2 options would be CT-guided needle biopsy versus surgical resection.  There is not much downside to a CT-guided needle biopsy, except that it can be very difficult to diagnose a thymoma based on that.  Therefore a negative biopsy would not really give me any sense of relief.  On the other hand surgical resection would provide a definitive diagnosis and at least initial treatment depending on what the pathology shows.  I described the proposed operation to him.  We will plan to do a robotic right VATS for resection of the pleural-based masses.  He understands that 1 or both of these may involve the nerve root.  He understands the degree of pain is unpredictable.  I informed him of the indications, risk, benefits, and alternatives.  He is a low risk patient for any major complications such as MI, DVT, PE, bleeding, or death.  He wishes to think over his options and further discuss with his wife prior to making a decision.  I am happy to talk to him anytime if I can help make a decision.  Mark Standard Mark Hockey, MD [facility] [phone]      Plan:   Mark Nakayama, MD [facility] [phone]

## 2022-06-22 NOTE — H&P (View-Only) (Signed)
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  He had a resection by Dr. Servando [name] in 2014.  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  He did not receive any adjuvant therapy.  After Dr. Servando [name] and Dr. Jana [name] retired he switches follow-up here to me.  I saw him a couple of weeks ago with an annual CT scan.  There were 2 pleural-based masses in the lower right chest.  Those have been present on the scan a year prior but had not been mentioned.  There had been some interval growth.  He went to have a PET/CT and now returns along with his wife to discuss treatment options.   Current Outpatient Medications  Medication Sig Dispense Refill   Cholecalciferol (VITAMIN D3) 1.25 MG (50000 UT) CAPS Take 1 capsule by mouth once a week. 12 capsule 5   No current facility-administered medications for this visit.    Physical Exam BP 124/74   Pulse 64   Resp 20   Ht '5\' 6"'$  (1.676 m)   Wt 148 lb (67.1 kg)   SpO2 97% Comment: RA  BMI 23.89 kg/m  Well-appearing 46 year old man in no acute distress  Diagnostic Tests: NUCLEAR MEDICINE PET SKULL BASE TO THIGH   TECHNIQUE: 8.52 mCi F-18 FDG was injected intravenously. Full-ring PET imaging was performed from the skull base to thigh after the radiotracer. CT data was obtained and used for attenuation correction and anatomic localization.   Fasting blood glucose: 107 mg/dl   COMPARISON:  CT chest 06/08/2022 and PET-CT 08/13/2019   FINDINGS: Mediastinal blood pool activity: SUV max 1.60   Liver activity: SUV max NA   NECK: No hypermetabolic lymph nodes in the neck.   Incidental CT findings: None   CHEST: No tracer avid axillary, supraclavicular, mediastinal, or hilar lymph nodes.   No signs of recurrent tracer avid tumor within the anterior mediastinum.    No tracer avid pulmonary nodule or mass.   The pleural base soft tissue nodule overlying the posteromedial right lower lobe measures 2.2 x 0.7 cm with SUV max of 1.63, image 110/2. In retrospect this was present on the CT from 07/30/2021 measuring 1.8 x 0.7 cm.   Second area of pleural nodularity is noted at a slightly more caudal level measuring 1.6 x 0.6 cm with SUV max 1.37. On 07/30/2021 this measured approximately 1.5 x 0.5 cm.   Incidental CT findings: No pleural effusion identified. Aortic atherosclerotic calcifications.   ABDOMEN/PELVIS: No abnormal hypermetabolic activity within the liver, pancreas, adrenal glands, or spleen. No hypermetabolic lymph nodes in the abdomen or pelvis.   Incidental CT findings: None.   SKELETON: No focal hypermetabolic activity to suggest skeletal metastasis.   Incidental CT findings: None.   IMPRESSION: 1. No signs of tracer avid tumor recurrence within the anterior mediastinum. 2. There are 2 pleural-based soft tissue nodules overlying the posteromedial right lower lobe which slightly increased when compared with 07/30/2021. There is mild tracer uptake associated with these lesions with SUV max of 1.63 and 1.37. Etiology indeterminate. However, given the progression compared with the original PET-CT from 08/13/2019 continued interval surveillance versus biopsy is advised. 3. Aortic Atherosclerosis (ICD10-I70.0).  Electronically Signed   By: [name] M.D.   On: 06/22/2022 11:39 I personally reviewed the PET/CT images.  There is activity in the pleural-based soft tissue nodules.  Relatively low-grade.  Neither rules in or rules out recurrent thymoma versus other potential etiologies, i.e. schwannoma.  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  He has been followed since then with no evidence of recurrent disease.  An annual CT for follow-up was done  a few weeks ago and showed 2 pleural-based masses in the right chest posteriorly.  These actually were present a year prior but were not mentioned in the report.  They were not present in 2021.  There was a slight increase in size over the 10 months between the scans.  Therefore I recommended a PET/CT.  PET/CT images were reviewed with Dr. [name] and his wife.  There is mild uptake.  The findings are definitive one way or the other.  While I still think this is possible to be an alternative etiology, I cannot rule out the possibility of recurrence.  By the same token this would be a pretty unusual pattern of recurrence.  We discussed 3 potential options.  One would be continued radiographic follow-up.  Since there was interval growth and there is activity on PET I would not be in favor that option.  The other 2 options would be CT-guided needle biopsy versus surgical resection.  There is not much downside to a CT-guided needle biopsy, except that it can be very difficult to diagnose a thymoma based on that.  Therefore a negative biopsy would not really give me any sense of relief.  On the other hand surgical resection would provide a definitive diagnosis and at least initial treatment depending on what the pathology shows.  I described the proposed operation to him.  We will plan to do a robotic right VATS for resection of the pleural-based masses.  He understands that 1 or both of these may involve the nerve root.  He understands the degree of pain is unpredictable.  I informed him of the indications, risk, benefits, and alternatives.  He is a low risk patient for any major complications such as MI, DVT, PE, bleeding, or death.  He wishes to think over his options and further discuss with his wife prior to making a decision.  I am happy to talk to him anytime if I can help make a decision.  Mark Standard Mark Hockey, MD [facility] [phone]      Plan:   Mark Nakayama, MD [facility] [phone]

## 2022-06-23 ENCOUNTER — Other Ambulatory Visit (HOSPITAL_COMMUNITY): Payer: Self-pay | Admitting: Interventional Radiology

## 2022-06-23 DIAGNOSIS — Z87898 Personal history of other specified conditions: Secondary | ICD-10-CM

## 2022-06-24 ENCOUNTER — Encounter: Payer: Self-pay | Admitting: General Practice

## 2022-06-24 NOTE — Progress Notes (Unsigned)
Criselda Peaches, MD  Allen Kell, [place] Janett Billow,   I am ordering AND approving a CT biopsy of [name].  Approved for CT guided bx of right subpleural nodule.     Please schedule for me to do at [facility] on Tuesday 11/7.  Create a slot if you have to.  Call me directly if there are any issues.  [phone].   Thanks!   Lieutenant Diego,   Criselda Peaches, MD   Pager: [phone]  Clinic: [phone]

## 2022-06-29 ENCOUNTER — Encounter: Payer: 59 | Admitting: Thoracic Surgery (Cardiothoracic Vascular Surgery)

## 2022-07-02 ENCOUNTER — Other Ambulatory Visit (HOSPITAL_COMMUNITY): Payer: Self-pay | Admitting: Student

## 2022-07-02 DIAGNOSIS — D4989 Neoplasm of unspecified behavior of other specified sites: Secondary | ICD-10-CM

## 2022-07-06 ENCOUNTER — Ambulatory Visit (HOSPITAL_COMMUNITY)
Admission: RE | Admit: 2022-07-06 | Discharge: 2022-07-06 | Disposition: A | Discharge status: Home / Self Care | Payer: 59 | Source: Ambulatory Visit | Attending: Interventional Radiology | Admitting: Interventional Radiology

## 2022-07-06 ENCOUNTER — Other Ambulatory Visit: Payer: Self-pay | Admitting: *Deleted

## 2022-07-06 ENCOUNTER — Encounter: Payer: Self-pay | Admitting: *Deleted

## 2022-07-06 ENCOUNTER — Telehealth: Payer: Self-pay | Admitting: Radiation Oncology

## 2022-07-06 DIAGNOSIS — R222 Localized swelling, mass and lump, trunk: Secondary | ICD-10-CM

## 2022-07-06 NOTE — Telephone Encounter (Signed)
11/7 @ 3:20 pm spoke to patient.  He stated 11/9 @ 10 am would not work for him due to work.  He stated will call back after look at his schedule to confirm his availability.  Waiting on call back.

## 2022-07-08 ENCOUNTER — Telehealth: Payer: Self-pay | Admitting: Radiation Oncology

## 2022-07-08 NOTE — Telephone Encounter (Signed)
11/9 @ 8:50 am Left voicemail for patient to call our office to confirm date/time available to be schedule for consult/waiting on call back.

## 2022-07-09 ENCOUNTER — Telehealth: Payer: Self-pay | Admitting: Radiation Oncology

## 2022-07-09 NOTE — Telephone Encounter (Signed)
11/10 @ 1:40 pm spoke to patient.  He stated spoke to rad oncologist and decided to decline services at this time.

## 2022-07-12 NOTE — Pre-Procedure Instructions (Signed)
Surgical Instructions    Your procedure is scheduled on July 15, 2022.  Report to [facility] Main Entrance "A" at 11:20 A.M., then check in with the Admitting office.  Call this number if you have problems the morning of surgery:  [phone]   If you have any questions prior to your surgery date call [phone]: Open Monday-Friday 8am-4pm    Remember:  Do not eat or drink after midnight the night before your surgery     Take these medicines the morning of surgery with A SIP OF WATER:  NONE   As of today, STOP taking any Aspirin (unless otherwise instructed by your surgeon) Aleve, Naproxen, Ibuprofen, Motrin, Advil, Goody's, BC's, all herbal medications, fish oil, and all vitamins.                     Do NOT Smoke (Tobacco/Vaping) for 24 hours prior to your procedure.  If you use a CPAP at night, you may bring your mask/headgear for your overnight stay.   Contacts, glasses, piercing's, hearing aid's, dentures or partials may not be worn into surgery, please bring cases for these belongings.    For patients admitted to the hospital, discharge time will be determined by your treatment team.   Patients discharged the day of surgery will not be allowed to drive home, and someone needs to stay with them for 24 hours.  SURGICAL WAITING ROOM VISITATION Patients having surgery or a procedure may have no more than 2 support people in the waiting area - these visitors may rotate.   Children under the age of 68 must have an adult with them who is not the patient. If the patient needs to stay at the hospital during part of their recovery, the visitor guidelines for inpatient rooms apply. Pre-op nurse will coordinate an appropriate time for 1 support person to accompany patient in pre-op.  This support person may not rotate.   Please refer to the [facility] website for the visitor guidelines for Inpatients (after your surgery is over and you are in a regular room).    Special  instructions:   [place]- Preparing For Surgery  Before surgery, you can play an important role. Because skin is not sterile, your skin needs to be as free of germs as possible. You can reduce the number of germs on your skin by washing with CHG (chlorahexidine gluconate) Soap before surgery.  CHG is an antiseptic cleaner which kills germs and bonds with the skin to continue killing germs even after washing.    Oral Hygiene is also important to reduce your risk of infection.  Remember - BRUSH YOUR TEETH THE MORNING OF SURGERY WITH YOUR REGULAR TOOTHPASTE  Please do not use if you have an allergy to CHG or antibacterial soaps. If your skin becomes reddened/irritated stop using the CHG.  Do not shave (including legs and underarms) for at least 48 hours prior to first CHG shower. It is OK to shave your face.  Please follow these instructions carefully.   Shower the NIGHT BEFORE SURGERY and the MORNING OF SURGERY  If you chose to wash your hair, wash your hair first as usual with your normal shampoo.  After you shampoo, rinse your hair and body thoroughly to remove the shampoo.  Use CHG Soap as you would any other liquid soap. You can apply CHG directly to the skin and wash gently with a scrungie or a clean washcloth.   Apply the CHG Soap to your body  ONLY FROM THE NECK DOWN.  Do not use on open wounds or open sores. Avoid contact with your eyes, ears, mouth and genitals (private parts). Wash Face and genitals (private parts)  with your normal soap.   Wash thoroughly, paying special attention to the area where your surgery will be performed.  Thoroughly rinse your body with warm water from the neck down.  DO NOT shower/wash with your normal soap after using and rinsing off the CHG Soap.  Pat yourself dry with a CLEAN TOWEL.  Wear CLEAN PAJAMAS to bed the night before surgery  Place CLEAN SHEETS on your bed the night before your surgery  DO NOT SLEEP WITH PETS.   Day of  Surgery: Take a shower with CHG soap. Do not wear jewelry or makeup Do not wear lotions, powders, perfumes/colognes, or deodorant. Do not shave 48 hours prior to surgery.  Men may shave face and neck. Do not bring valuables to the hospital.  [facility] is not responsible for any belongings or valuables. Do not wear nail polish, gel polish, artificial nails, or any other type of covering on natural nails (fingers and toes) If you have artificial nails or gel coating that need to be removed by a nail salon, please have this removed prior to surgery. Artificial nails or gel coating may interfere with anesthesia's ability to adequately monitor your vital signs.  Wear Clean/Comfortable clothing the morning of surgery Remember to brush your teeth WITH YOUR REGULAR TOOTHPASTE.   Please read over the following fact sheets that you were given.    If you received a COVID test during your pre-op visit  it is requested that you wear a mask when out in public, stay away from anyone that may not be feeling well and notify your surgeon if you develop symptoms. If you have been in contact with anyone that has tested positive in the last 10 days please notify you surgeon.

## 2022-07-13 ENCOUNTER — Other Ambulatory Visit (HOSPITAL_COMMUNITY): Payer: 59

## 2022-07-13 ENCOUNTER — Encounter (HOSPITAL_COMMUNITY): Payer: Self-pay

## 2022-07-13 ENCOUNTER — Other Ambulatory Visit: Payer: Self-pay

## 2022-07-13 ENCOUNTER — Ambulatory Visit (HOSPITAL_COMMUNITY)
Admission: RE | Admit: 2022-07-13 | Discharge: 2022-07-13 | Disposition: A | Discharge status: Home / Self Care | Payer: 59 | Source: Ambulatory Visit | Attending: Thoracic Surgery (Cardiothoracic Vascular Surgery) | Admitting: Thoracic Surgery (Cardiothoracic Vascular Surgery)

## 2022-07-13 ENCOUNTER — Encounter (HOSPITAL_COMMUNITY)
Admission: RE | Admit: 2022-07-13 | Discharge: 2022-07-13 | Disposition: A | Discharge status: Home / Self Care | Payer: 59 | Source: Ambulatory Visit | Attending: Thoracic Surgery (Cardiothoracic Vascular Surgery) | Admitting: Thoracic Surgery (Cardiothoracic Vascular Surgery)

## 2022-07-13 VITALS — BP 105/61 | HR 96 | Temp 97.5°F | Resp 18 | Ht 66.0 in | Wt 146.3 lb

## 2022-07-13 DIAGNOSIS — Z01818 Encounter for other preprocedural examination: Secondary | ICD-10-CM | POA: Diagnosis not present

## 2022-07-13 DIAGNOSIS — R222 Localized swelling, mass and lump, trunk: Secondary | ICD-10-CM | POA: Insufficient documentation

## 2022-07-13 DIAGNOSIS — Z1152 Encounter for screening for COVID-19: Secondary | ICD-10-CM | POA: Insufficient documentation

## 2022-07-13 LAB — URINALYSIS, ROUTINE W REFLEX MICROSCOPIC
Bilirubin Urine: NEGATIVE
Glucose, UA: NEGATIVE mg/dL
Hgb urine dipstick: NEGATIVE
Ketones, ur: NEGATIVE mg/dL
Leukocytes,Ua: NEGATIVE
Nitrite: NEGATIVE
Protein, ur: NEGATIVE mg/dL
Specific Gravity, Urine: 1.005 (ref 1.005–1.030)
pH: 8 (ref 5.0–8.0)

## 2022-07-13 LAB — CBC
HCT: 39.9 % (ref 39.0–52.0)
Hemoglobin: 13 g/dL (ref 13.0–17.0)
MCH: 26.5 pg (ref 26.0–34.0)
MCHC: 32.6 g/dL (ref 30.0–36.0)
MCV: 81.3 fL (ref 80.0–100.0)
Platelets: 261 10*3/uL (ref 150–400)
RBC: 4.91 MIL/uL (ref 4.22–5.81)
RDW: 13.2 % (ref 11.5–15.5)
WBC: 4.5 10*3/uL (ref 4.0–10.5)
nRBC: 0 % (ref 0.0–0.2)

## 2022-07-13 LAB — PROTIME-INR
INR: 1.1 (ref 0.8–1.2)
Prothrombin Time: 13.6 seconds (ref 11.4–15.2)

## 2022-07-13 LAB — COMPREHENSIVE METABOLIC PANEL
ALT: 11 U/L (ref 0–44)
AST: 24 U/L (ref 15–41)
Albumin: 4 g/dL (ref 3.5–5.0)
Alkaline Phosphatase: 49 U/L (ref 38–126)
Anion gap: 5 (ref 5–15)
BUN: 8 mg/dL (ref 6–20)
CO2: 28 mmol/L (ref 22–32)
Calcium: 9.2 mg/dL (ref 8.9–10.3)
Chloride: 108 mmol/L (ref 98–111)
Creatinine, Ser: 0.87 mg/dL (ref 0.61–1.24)
GFR, Estimated: >60 mL/min (ref >60)
Glucose, Bld: 76 mg/dL (ref 70–99)
Potassium: 3.6 mmol/L (ref 3.5–5.1)
Sodium: 141 mmol/L (ref 135–145)
Total Bilirubin: 1 mg/dL (ref 0.3–1.2)
Total Protein: 6.7 g/dL (ref 6.5–8.1)

## 2022-07-13 LAB — SURGICAL PCR SCREEN
MRSA, PCR: NEGATIVE
Staphylococcus aureus: NEGATIVE

## 2022-07-13 LAB — TYPE AND SCREEN
ABO/RH(D): O POS
Antibody Screen: NEGATIVE

## 2022-07-13 LAB — APTT: aPTT: 33 seconds (ref 24–36)

## 2022-07-13 NOTE — Progress Notes (Signed)
PCP - Dr. Reynold Bowen Cardiologist - denies  PPM/ICD - denies   Chest x-ray - 07/13/22 EKG - 07/13/22 Stress Test - denies ECHO - denies Cardiac Cath - denies  Sleep Study - denies   DM- denies  Last dose of GLP1 agonist-  n/a   ASA/Blood Thinner Instructions: n/a   ERAS Protcol - no, NPO   COVID TEST- 07/13/22   Anesthesia review: n/a  Patient denies shortness of breath, fever, cough and chest pain at PAT appointment   All instructions explained to the patient, with a verbal understanding of the material. Patient agrees to go over the instructions while at home for a better understanding. Patient also instructed to wear a mask in public after being tested for COVID-19. The opportunity to ask questions was provided.

## 2022-07-14 LAB — SARS CORONAVIRUS 2 (TAT 6-24 HRS): SARS Coronavirus 2: NEGATIVE

## 2022-07-14 NOTE — Progress Notes (Signed)
Criselda Peaches, MD  Donita Brooks D Yes, please.  Cancel for now.  He'll reach out to me if he wants to reschedule.  Thank you!  [place]

## 2022-07-15 ENCOUNTER — Observation Stay (HOSPITAL_COMMUNITY)
Admission: RE | Admit: 2022-07-15 | Discharge: 2022-07-16 | Disposition: A | Discharge status: Home / Self Care | Payer: 59 | Attending: Thoracic Surgery (Cardiothoracic Vascular Surgery) | Admitting: Thoracic Surgery (Cardiothoracic Vascular Surgery)

## 2022-07-15 ENCOUNTER — Encounter (HOSPITAL_COMMUNITY): Payer: Self-pay | Admitting: Thoracic Surgery (Cardiothoracic Vascular Surgery)

## 2022-07-15 ENCOUNTER — Inpatient Hospital Stay (HOSPITAL_COMMUNITY): Payer: 59

## 2022-07-15 ENCOUNTER — Inpatient Hospital Stay (HOSPITAL_COMMUNITY): Payer: 59 | Admitting: Physician Assistant

## 2022-07-15 ENCOUNTER — Other Ambulatory Visit: Payer: Self-pay

## 2022-07-15 ENCOUNTER — Inpatient Hospital Stay (HOSPITAL_COMMUNITY): Payer: 59 | Admitting: Anesthesiology

## 2022-07-15 ENCOUNTER — Encounter (HOSPITAL_COMMUNITY)
Admission: RE | Disposition: A | Discharge status: Home / Self Care | Payer: Self-pay | Source: Home / Self Care | Attending: Thoracic Surgery (Cardiothoracic Vascular Surgery)

## 2022-07-15 DIAGNOSIS — J948 Other specified pleural conditions: Principal | ICD-10-CM | POA: Insufficient documentation

## 2022-07-15 DIAGNOSIS — J939 Pneumothorax, unspecified: Secondary | ICD-10-CM | POA: Diagnosis not present

## 2022-07-15 DIAGNOSIS — R222 Localized swelling, mass and lump, trunk: Secondary | ICD-10-CM | POA: Diagnosis not present

## 2022-07-15 DIAGNOSIS — G9589 Other specified diseases of spinal cord: Secondary | ICD-10-CM

## 2022-07-15 DIAGNOSIS — Z9889 Other specified postprocedural states: Secondary | ICD-10-CM

## 2022-07-15 DIAGNOSIS — D4989 Neoplasm of unspecified behavior of other specified sites: Secondary | ICD-10-CM | POA: Diagnosis present

## 2022-07-15 HISTORY — PX: INTERCOSTAL NERVE BLOCK: SHX5021

## 2022-07-15 SURGERY — EXCISION, MASS, MEDIASTINUM, ROBOT-ASSISTED
Anesthesia: General | Laterality: Right

## 2022-07-15 MED ORDER — ROCURONIUM BROMIDE 10 MG/ML (PF) SYRINGE
PREFILLED_SYRINGE | INTRAVENOUS | Status: DC | PRN
  Administered 2022-07-15: 60 mg via INTRAVENOUS
  Administered 2022-07-15: 20 mg via INTRAVENOUS

## 2022-07-15 MED ORDER — ACETAMINOPHEN 500 MG PO TABS
1000.0000 mg | ORAL_TABLET | Freq: Four times a day (QID) | ORAL | Status: DC
  Filled 2022-07-15: qty 2

## 2022-07-15 MED ORDER — CHLORHEXIDINE GLUCONATE 0.12 % MT SOLN: 15.0000 mL | Freq: Once | OROMUCOSAL | Status: AC

## 2022-07-15 MED ORDER — ONDANSETRON HCL 4 MG/2ML IJ SOLN
4.0000 mg | Freq: Four times a day (QID) | INTRAMUSCULAR | Status: DC | PRN
  Administered 2022-07-15: 4 mg via INTRAVENOUS
  Filled 2022-07-15: qty 2

## 2022-07-15 MED ORDER — MIDAZOLAM HCL 2 MG/2ML IJ SOLN
INTRAMUSCULAR | Status: DC | PRN
  Administered 2022-07-15: 2 mg via INTRAVENOUS

## 2022-07-15 MED ORDER — SODIUM CHLORIDE FLUSH 0.9 % IV SOLN
INTRAVENOUS | Status: DC | PRN
  Administered 2022-07-15: 93 mL

## 2022-07-15 MED ORDER — MIDAZOLAM HCL 2 MG/2ML IJ SOLN
INTRAMUSCULAR | Status: AC
  Filled 2022-07-15: qty 2

## 2022-07-15 MED ORDER — ENOXAPARIN SODIUM 40 MG/0.4ML IJ SOSY
40.0000 mg | PREFILLED_SYRINGE | INTRAMUSCULAR | Status: DC
  Filled 2022-07-15: qty 0.4

## 2022-07-15 MED ORDER — CEFAZOLIN SODIUM-DEXTROSE 2-4 GM/100ML-% IV SOLN
2.0000 g | INTRAVENOUS | Status: AC
  Administered 2022-07-15: 2 g via INTRAVENOUS

## 2022-07-15 MED ORDER — ACETAMINOPHEN 160 MG/5ML PO SOLN: 1000.0000 mg | Freq: Four times a day (QID) | ORAL | Status: DC

## 2022-07-15 MED ORDER — CEFAZOLIN SODIUM-DEXTROSE 2-4 GM/100ML-% IV SOLN
2.0000 g | Freq: Three times a day (TID) | INTRAVENOUS | Status: AC
  Administered 2022-07-15 – 2022-07-16 (×2): 2 g via INTRAVENOUS
  Filled 2022-07-15 (×2): qty 100

## 2022-07-15 MED ORDER — ACETAMINOPHEN 10 MG/ML IV SOLN
INTRAVENOUS | Status: AC
  Filled 2022-07-15: qty 100

## 2022-07-15 MED ORDER — PROPOFOL 10 MG/ML IV BOLUS
INTRAVENOUS | Status: DC | PRN
  Administered 2022-07-15: 150 mg via INTRAVENOUS

## 2022-07-15 MED ORDER — MENTHOL 3 MG MT LOZG
1.0000 | LOZENGE | OROMUCOSAL | Status: DC | PRN
  Filled 2022-07-15: qty 9

## 2022-07-15 MED ORDER — OXYCODONE HCL 5 MG PO TABS: 5.0000 mg | ORAL_TABLET | ORAL | Status: DC | PRN

## 2022-07-15 MED ORDER — SUCCINYLCHOLINE CHLORIDE 200 MG/10ML IV SOSY
PREFILLED_SYRINGE | INTRAVENOUS | Status: AC
  Filled 2022-07-15: qty 10

## 2022-07-15 MED ORDER — FENTANYL CITRATE (PF) 250 MCG/5ML IJ SOLN
INTRAMUSCULAR | Status: AC
  Filled 2022-07-15: qty 5

## 2022-07-15 MED ORDER — ROCURONIUM BROMIDE 10 MG/ML (PF) SYRINGE
PREFILLED_SYRINGE | INTRAVENOUS | Status: AC
  Filled 2022-07-15: qty 10

## 2022-07-15 MED ORDER — LACTATED RINGERS IV SOLN: INTRAVENOUS | Status: DC | PRN

## 2022-07-15 MED ORDER — ONDANSETRON HCL 4 MG/2ML IJ SOLN
INTRAMUSCULAR | Status: DC | PRN
  Administered 2022-07-15: 4 mg via INTRAVENOUS

## 2022-07-15 MED ORDER — EPHEDRINE 5 MG/ML INJ
INTRAVENOUS | Status: AC
  Filled 2022-07-15: qty 5

## 2022-07-15 MED ORDER — BUPIVACAINE LIPOSOME 1.3 % IJ SUSP
INTRAMUSCULAR | Status: AC
  Filled 2022-07-15: qty 20

## 2022-07-15 MED ORDER — LIDOCAINE 2% (20 MG/ML) 5 ML SYRINGE
INTRAMUSCULAR | Status: AC
  Filled 2022-07-15: qty 5

## 2022-07-15 MED ORDER — PANTOPRAZOLE SODIUM 40 MG PO TBEC
40.0000 mg | DELAYED_RELEASE_TABLET | Freq: Every day | ORAL | Status: DC
  Administered 2022-07-15: 40 mg via ORAL
  Filled 2022-07-15: qty 1

## 2022-07-15 MED ORDER — CEFAZOLIN SODIUM-DEXTROSE 2-4 GM/100ML-% IV SOLN
INTRAVENOUS | Status: AC
  Filled 2022-07-15: qty 100

## 2022-07-15 MED ORDER — DEXAMETHASONE SODIUM PHOSPHATE 10 MG/ML IJ SOLN
INTRAMUSCULAR | Status: AC
  Filled 2022-07-15: qty 1

## 2022-07-15 MED ORDER — ONDANSETRON HCL 4 MG/2ML IJ SOLN
INTRAMUSCULAR | Status: AC
  Filled 2022-07-15: qty 2

## 2022-07-15 MED ORDER — ACETAMINOPHEN 500 MG PO TABS
1000.0000 mg | ORAL_TABLET | Freq: Four times a day (QID) | ORAL | Status: DC
  Administered 2022-07-15: 1000 mg via ORAL
  Filled 2022-07-15: qty 2

## 2022-07-15 MED ORDER — ACETAMINOPHEN 10 MG/ML IV SOLN
INTRAVENOUS | Status: DC | PRN
  Administered 2022-07-15: 1000 mg via INTRAVENOUS

## 2022-07-15 MED ORDER — SENNOSIDES-DOCUSATE SODIUM 8.6-50 MG PO TABS
1.0000 | ORAL_TABLET | Freq: Every day | ORAL | Status: DC
  Administered 2022-07-15: 1 via ORAL
  Filled 2022-07-15: qty 1

## 2022-07-15 MED ORDER — PROPOFOL 10 MG/ML IV BOLUS
INTRAVENOUS | Status: AC
  Filled 2022-07-15: qty 20

## 2022-07-15 MED ORDER — GABAPENTIN 300 MG PO CAPS: 300.0000 mg | ORAL_CAPSULE | Freq: Two times a day (BID) | ORAL | Status: DC

## 2022-07-15 MED ORDER — LUNG SURGERY BOOK
Freq: Once | Status: AC
  Filled 2022-07-15: qty 1

## 2022-07-15 MED ORDER — SODIUM CHLORIDE 0.9 % IV SOLN
INTRAVENOUS | Status: AC | PRN
  Administered 2022-07-15: 1000 mL

## 2022-07-15 MED ORDER — CHLORHEXIDINE GLUCONATE 0.12 % MT SOLN
OROMUCOSAL | Status: AC
  Administered 2022-07-15: 15 mL via OROMUCOSAL
  Filled 2022-07-15: qty 15

## 2022-07-15 MED ORDER — PHENYLEPHRINE 80 MCG/ML (10ML) SYRINGE FOR IV PUSH (FOR BLOOD PRESSURE SUPPORT)
PREFILLED_SYRINGE | INTRAVENOUS | Status: DC | PRN
  Administered 2022-07-15 (×2): 160 ug via INTRAVENOUS
  Administered 2022-07-15: 80 ug via INTRAVENOUS

## 2022-07-15 MED ORDER — SUGAMMADEX SODIUM 200 MG/2ML IV SOLN
INTRAVENOUS | Status: DC | PRN
  Administered 2022-07-15: 200 mg via INTRAVENOUS

## 2022-07-15 MED ORDER — PHENYLEPHRINE 80 MCG/ML (10ML) SYRINGE FOR IV PUSH (FOR BLOOD PRESSURE SUPPORT)
PREFILLED_SYRINGE | INTRAVENOUS | Status: AC
  Filled 2022-07-15: qty 20

## 2022-07-15 MED ORDER — BISACODYL 5 MG PO TBEC: 10.0000 mg | DELAYED_RELEASE_TABLET | Freq: Every day | ORAL | Status: DC

## 2022-07-15 MED ORDER — ORAL CARE MOUTH RINSE: 15.0000 mL | Freq: Once | OROMUCOSAL | Status: AC

## 2022-07-15 MED ORDER — 0.9 % SODIUM CHLORIDE (POUR BTL) OPTIME
TOPICAL | Status: DC | PRN
  Administered 2022-07-15: 2000 mL

## 2022-07-15 MED ORDER — BUPIVACAINE HCL (PF) 0.5 % IJ SOLN
INTRAMUSCULAR | Status: AC
  Filled 2022-07-15: qty 30

## 2022-07-15 MED ORDER — TRAMADOL HCL 50 MG PO TABS: 50.0000 mg | ORAL_TABLET | Freq: Four times a day (QID) | ORAL | Status: DC | PRN

## 2022-07-15 MED ORDER — KETOROLAC TROMETHAMINE 15 MG/ML IJ SOLN
15.0000 mg | Freq: Four times a day (QID) | INTRAMUSCULAR | Status: DC
  Administered 2022-07-15: 15 mg via INTRAVENOUS
  Filled 2022-07-15: qty 1

## 2022-07-15 MED ORDER — GABAPENTIN 300 MG PO CAPS
300.0000 mg | ORAL_CAPSULE | Freq: Every day | ORAL | Status: DC
  Filled 2022-07-15: qty 1

## 2022-07-15 MED ORDER — LIDOCAINE 2% (20 MG/ML) 5 ML SYRINGE
INTRAMUSCULAR | Status: DC | PRN
  Administered 2022-07-15: 60 mg via INTRAVENOUS

## 2022-07-15 MED ORDER — FENTANYL CITRATE (PF) 250 MCG/5ML IJ SOLN
INTRAMUSCULAR | Status: DC | PRN
  Administered 2022-07-15: 150 ug via INTRAVENOUS
  Administered 2022-07-15 (×2): 50 ug via INTRAVENOUS

## 2022-07-15 MED ORDER — DEXAMETHASONE SODIUM PHOSPHATE 10 MG/ML IJ SOLN
INTRAMUSCULAR | Status: DC | PRN
  Administered 2022-07-15: 10 mg via INTRAVENOUS

## 2022-07-15 MED ORDER — PHENYLEPHRINE HCL-NACL 20-0.9 MG/250ML-% IV SOLN
INTRAVENOUS | Status: DC | PRN
  Administered 2022-07-15: 30 ug/min via INTRAVENOUS

## 2022-07-15 MED ORDER — LACTATED RINGERS IV SOLN: INTRAVENOUS | Status: DC

## 2022-07-15 SURGICAL SUPPLY — 61 items
BLADE STERNUM SYSTEM 6 (BLADE) IMPLANT
CANNULA REDUC XI 12-8 STAPL (CANNULA)
CANNULA REDUCER 12-8 DVNC XI (CANNULA) IMPLANT
CLIP TI WIDE RED SMALL 6 (CLIP) IMPLANT
DEFOGGER SCOPE WARMER CLEARIFY (MISCELLANEOUS) ×2 IMPLANT
DERMABOND ADVANCED .7 DNX12 (GAUZE/BANDAGES/DRESSINGS) IMPLANT
DRAIN CONNECTOR BLAKE 1:1 (MISCELLANEOUS) IMPLANT
DRAPE ARM DVNC X/XI (DISPOSABLE) ×8 IMPLANT
DRAPE COLUMN DVNC XI (DISPOSABLE) ×2 IMPLANT
DRAPE CV SPLIT W-CLR ANES SCRN (DRAPES) ×2 IMPLANT
DRAPE DA VINCI XI ARM (DISPOSABLE) ×8
DRAPE DA VINCI XI COLUMN (DISPOSABLE) ×2
DRAPE HALF SHEET 40X57 (DRAPES) IMPLANT
DRAPE ORTHO SPLIT 77X108 STRL (DRAPES) ×2
DRAPE SURG ORHT 6 SPLT 77X108 (DRAPES) ×2 IMPLANT
ELECT REM PT RETURN 9FT ADLT (ELECTROSURGICAL) ×2
ELECTRODE REM PT RTRN 9FT ADLT (ELECTROSURGICAL) ×2 IMPLANT
FELT TEFLON 1X6 (MISCELLANEOUS) IMPLANT
GAUZE KITTNER 4X8 (MISCELLANEOUS) ×2 IMPLANT
GAUZE SPONGE 4X4 12PLY STRL (GAUZE/BANDAGES/DRESSINGS) IMPLANT
GLOVE SS BIOGEL STRL SZ 7.5 (GLOVE) ×4 IMPLANT
GOWN STRL REUS W/ TWL LRG LVL3 (GOWN DISPOSABLE) ×2 IMPLANT
GOWN STRL REUS W/ TWL XL LVL3 (GOWN DISPOSABLE) ×4 IMPLANT
GOWN STRL REUS W/TWL 2XL LVL3 (GOWN DISPOSABLE) ×2 IMPLANT
GOWN STRL REUS W/TWL LRG LVL3 (GOWN DISPOSABLE) ×2
GOWN STRL REUS W/TWL XL LVL3 (GOWN DISPOSABLE) ×4
HEMOSTAT SURGICEL 2X14 (HEMOSTASIS) ×4 IMPLANT
IRRIGATION STRYKERFLOW (MISCELLANEOUS) ×2 IMPLANT
IRRIGATOR STRYKERFLOW (MISCELLANEOUS) ×2
IV SODIUM CHL 0.9% 500ML (IV SOLUTION) ×2 IMPLANT
KIT SUCTION CATH 14FR (SUCTIONS) IMPLANT
NDL 25GX 5/8IN NON SAFETY (NEEDLE) ×2 IMPLANT
NDL SPNL 22GX3.5 QUINCKE BK (NEEDLE) IMPLANT
NEEDLE 25GX 5/8IN NON SAFETY (NEEDLE) ×2 IMPLANT
NEEDLE SPNL 22GX3.5 QUINCKE BK (NEEDLE) ×2 IMPLANT
PACK CHEST (CUSTOM PROCEDURE TRAY) ×2 IMPLANT
PAD ARMBOARD 7.5X6 YLW CONV (MISCELLANEOUS) ×4 IMPLANT
PAD ELECT DEFIB RADIOL ZOLL (MISCELLANEOUS) IMPLANT
SEAL CANN UNIV 5-8 DVNC XI (MISCELLANEOUS) ×6 IMPLANT
SEAL XI 5MM-8MM UNIVERSAL (MISCELLANEOUS) ×14
SET TRI-LUMEN FLTR TB AIRSEAL (TUBING) ×2 IMPLANT
SPONGE TONSIL 1 RF SGL (DISPOSABLE) ×2 IMPLANT
STAPLER CANNULA SEAL DVNC XI (STAPLE) IMPLANT
STAPLER CANNULA SEAL XI (STAPLE)
SUT SILK 2 0 SH CR/8 (SUTURE) IMPLANT
SUT STEEL 6MS V (SUTURE) IMPLANT
SUT STEEL SZ 6 DBL 3X14 BALL (SUTURE) IMPLANT
SUT VIC AB 1 CTX 36 (SUTURE) ×2
SUT VIC AB 1 CTX36XBRD ANBCTR (SUTURE) ×2 IMPLANT
SUT VIC AB 2-0 CTX 36 (SUTURE) ×2 IMPLANT
SUT VIC AB 3-0 X1 27 (SUTURE) ×4 IMPLANT
SUT VICRYL 0 UR6 27IN ABS (SUTURE) ×4 IMPLANT
SYR 20CC LL (SYRINGE) ×4 IMPLANT
SYSTEM RETRIEVAL ANCHOR 8 (MISCELLANEOUS) IMPLANT
SYSTEM SAHARA CHEST DRAIN ATS (WOUND CARE) IMPLANT
TAPE CLOTH SURG 4X10 WHT LF (GAUZE/BANDAGES/DRESSINGS) IMPLANT
TOWEL GREEN STERILE (TOWEL DISPOSABLE) IMPLANT
TOWEL GREEN STERILE FF (TOWEL DISPOSABLE) IMPLANT
TRAY FOLEY SLVR 16FR TEMP STAT (SET/KITS/TRAYS/PACK) ×2 IMPLANT
TROCAR PORT AIRSEAL 12X150 (TUBING) IMPLANT
TROCAR PORT AIRSEAL 8X120 (TROCAR) IMPLANT

## 2022-07-15 NOTE — Anesthesia Procedure Notes (Signed)
Procedure Name: Intubation Date/Time: 07/15/2022 3:16 PM  Performed by: Janace Litten, CRNAPre-anesthesia Checklist: Patient identified, Emergency Drugs available, Suction available and Patient being monitored Patient Re-evaluated:Patient Re-evaluated prior to induction Oxygen Delivery Method: Circle System Utilized Preoxygenation: Pre-oxygenation with 100% oxygen Induction Type: IV induction Ventilation: Mask ventilation without difficulty Laryngoscope Size: Mac and 4 Grade View: Grade I Tube type: Oral Endobronchial tube: Left, Double lumen EBT, EBT position confirmed by auscultation and EBT position confirmed by fiberoptic bronchoscope and 39 Fr Number of attempts: 1 Airway Equipment and Method: Stylet Placement Confirmation: ETT inserted through vocal cords under direct vision, positive ETCO2 and breath sounds checked- equal and bilateral Tube secured with: Tape Dental Injury: Teeth and Oropharynx as per pre-operative assessment

## 2022-07-15 NOTE — Anesthesia Preprocedure Evaluation (Addendum)
Anesthesia Evaluation  Patient identified by MRN, date of birth, ID band Patient awake    Reviewed: Allergy & Precautions, H&P , NPO status , Patient's Chart, lab work & pertinent test results  Airway Mallampati: II   Neck ROM: full    Dental   Pulmonary former smoker   breath sounds clear to auscultation       Cardiovascular negative cardio ROS  Rhythm:regular Rate:Normal     Neuro/Psych negative neurological ROS     GI/Hepatic negative GI ROS, Neg liver ROS,,,  Endo/Other  negative endocrine ROS    Renal/GU negative Renal ROS     Musculoskeletal negative musculoskeletal ROS (+)    Abdominal   Peds  Hematology negative hematology ROS (+)   Anesthesia Other Findings PARAVERTEBRAL MASSES  Reproductive/Obstetrics                             Anesthesia Physical Anesthesia Plan  ASA: 2  Anesthesia Plan: General   Post-op Pain Management:    Induction: Intravenous  PONV Risk Score and Plan: 2 and Ondansetron, Dexamethasone, Midazolam and Treatment may vary due to age or medical condition  Airway Management Planned: Double Lumen EBT  Additional Equipment: ClearSight  Intra-op Plan:   Post-operative Plan: Extubation in OR  Informed Consent: I have reviewed the patients History and Physical, chart, labs and discussed the procedure including the risks, benefits and alternatives for the proposed anesthesia with the patient or authorized representative who has indicated his/her understanding and acceptance.     Dental advisory given  Plan Discussed with: CRNA, Anesthesiologist and Surgeon  Anesthesia Plan Comments:         Anesthesia Quick Evaluation

## 2022-07-15 NOTE — Transfer of Care (Signed)
Immediate Anesthesia Transfer of Care Note  Patient: [name] [facility]  Procedure(s) Performed: XI ROBOTIC ASSISTED RESECTION OF PARAVERTEBRAL MASSES (Right) INTERCOSTAL NERVE BLOCK  Patient Location: PACU  Anesthesia Type:General  Level of Consciousness: drowsy, patient cooperative, and responds to stimulation  Airway & Oxygen Therapy: Patient Spontanous Breathing  Post-op Assessment: Report given to RN and Post -op Vital signs reviewed and stable  Post vital signs: Reviewed and stable  Last Vitals:  Vitals Value Taken Time  BP 117/71 07/15/22 1705  Temp 36.4 C 07/15/22 1705  Pulse 79 07/15/22 1707  Resp 18 07/15/22 1707  SpO2 99 % 07/15/22 1707  Vitals shown include unvalidated device data.  Last Pain:  Vitals:   07/15/22 1155  TempSrc:   PainSc: 0-No pain         Complications: No notable events documented.

## 2022-07-15 NOTE — Hospital Course (Addendum)
History of present illness: At time of Dr. 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  He has been followed since then with no evidence of recurrent disease.  An annual CT for follow-up was done a few weeks ago and showed 2 pleural-based masses in the right chest posteriorly.  These actually were present a year prior but were not mentioned in the report.  They were not present in 2021.  There was a slight increase in size over the 10 months between the scans.  Therefore I recommended a PET/CT.   PET/CT images were reviewed with [name] and his wife.  There is mild uptake.  The findings are definitive one way or the other.  While I still think this is possible to be an alternative etiology, I cannot rule out the possibility of recurrence.  By the same token this would be a pretty unusual pattern of recurrence.   We discussed 3 potential options.  One would be continued radiographic follow-up.  Since there was interval growth and there is activity on PET I would not be in favor that option.  The other 2 options would be CT-guided needle biopsy versus surgical resection.  There is not much downside to a CT-guided needle biopsy, except that it can be very difficult to diagnose a thymoma based on that.  Therefore a negative biopsy would not really give me any sense of relief.  On the other hand surgical resection would provide a definitive diagnosis and at least initial treatment depending on what the pathology shows.   I described the proposed operation to him.  We will plan to do a robotic right VATS for resection of the pleural-based masses.  He understands that 1 or both of these may involve the nerve root.  He understands the degree of pain is unpredictable.  I informed him of the indications, risk, benefits, and alternatives.  He is a low risk patient for any major complications such as MI, DVT, PE, bleeding, or  death.  Hospital course: The patient was admitted electively on 07/15/2022 taken the operating room at which time he underwent a right robotic assisted thoracoscopic surgery for resection of 2 pleural-based masses.  He tolerated the procedure well was taken the PACU in stable condition.  The patient did well overnight.  His JP drain output remained low and was removed on POD #1.  He is hemodynamically stable.  He is ambulating independently.  Discharge instructions were reviewed with the patient.  He is medically stable for discharge today.

## 2022-07-15 NOTE — Interval H&P Note (Signed)
History and Physical Interval Note:  07/15/2022 1:02 PM  [name]  has presented today for surgery, with the diagnosis of PARAVERTEBRAL MASSES.  The various methods of treatment have been discussed with the patient and family. After consideration of risks, benefits and other options for treatment, the patient has consented to  Procedure(s): XI ROBOTIC ASSISTED RESECTION OF PARAVERTEBRAL MASSES (Right) as a surgical intervention.  The patient's history has been reviewed, patient examined, no change in status, stable for surgery.  I have reviewed the patient's chart and labs.  Questions were answered to the patient's satisfaction.     [name]

## 2022-07-15 NOTE — Plan of Care (Signed)

## 2022-07-15 NOTE — Op Note (Addendum)
NAMEQUASHAWN, [name] [name] MEDICAL RECORD NO: [id number] ACCOUNT NO: [id number] DATE OF BIRTH: 05-09-1976 FACILITY: [facility] LOCATION: [facility] PHYSICIAN: [name] [name]. [name] Hockey, MD  Operative Report   DATE OF PROCEDURE: 07/15/2022  PREOPERATIVE DIAGNOSIS:  Right pleural/ paravertebral masses.  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  PROCEDURE:   Xi robotic-assisted right thoracoscopy  Resection of right pleural/ paravertebral masses.   Intercostal nerve blocks levels 3 through 10.  SURGEON:  [name] [name]. [name] Hockey, MD  ASSISTANT:  [name] Pierini, PA-C.  ANESTHESIA:  General.  FINDINGS:  Two well circumscribed masses present on the posterolateral pleura.  No invasion of deep chest wall structures. Superior mass in close proximity to the intercostal bundle.  Frozen section revealed probable thymoma.  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  He recently had a CT which showed 2 pleural based masses in the lower right chest.  On PET/CT, there was mild uptake associated with both lesions.  We discussed potential  options of radiographic followup, CT-guided needle biopsy or surgical resection.  The indications, risks, benefits, and alternatives were discussed in detail with him.  He wished to proceed with surgical resection.  OPERATIVE NOTE:  Dr. [name] was brought to the preoperative holding area on 07/15/2022.  Anesthesia established intravenous access.  He was taken to the operating room and anesthetized and intubated with a double lumen endotracheal tube.  Intravenous  antibiotics were administered.  Sequential compression devices were placed on the calves for DVT prophylaxis.  He was placed in a left lateral decubitus position.  A Bair Hugger was placed for active warming.  The right chest was prepped and draped in  the usual sterile fashion.  Single lung ventilation of the left lung was initiated and was  tolerated well throughout the procedure.  A timeout was performed.  A solution containing 20 mL of liposomal bupivacaine, 30 mL of 0.5% bupivacaine and 50 mL of saline was prepared.  This solution was used for local at the incision sites as well as for the intercostal nerve blocks.  Incision was  made in the ninth interspace in approximately the midaxillary line.  A port was inserted.  The thoracoscope was advanced into the chest.  There was good isolation of the right lung.  Carbon dioxide was insufflated per protocol.  There were 2 masses readily apparent on the posterior pleural surface in close proximity to each other, but separate and distinct.  8 mm ports were placed anterior and posterior to the camera port.  Intercostal nerve blocks were performed from the third to the tenth  interspace injecting 10 mL of the bupivacaine solution into a subpleural plane at each level.  An 8 mm AirSeal port was placed in the tenth interspace anteriorly.  The dissection was begun on the lower mass.  The pleura was incised circumferentially  around the mass and then gently retracting on the mass, the mass was separated from the chest wall.  The mass appeared relatively encapsulated and was not involving or invading into deeper structures. There was a grossly complete resection.  Next, the  more superior mass was removed. Again, the pleura was scored circumferentially and the dissection was begun.  This mass was in close proximity to the intercostal bundle including the intercostal nerve.  Care was taken with resection of the mass not to  disturb those.  A small amount of the subpleural fat was removed along with the tumor.  Once both  tumors had been completely resected, they were placed into an endoscopic retrieval bag, which was brought out through the AirSeal port incision.  Final  inspection was made for hemostasis, which was good.  The robotic instruments were removed.  The robot was undocked.  The sponges that had  been placed into the chest during the dissection were removed.  The chest was copiously irrigated with saline.   Final inspection was made for hemostasis. A 19-French Blake drain was placed through the anterior eighth interspace incision, directed posteriorly and secured to the skin with #1 silk suture.  The remaining incisions were closed in [name] fashion.  The  chest drain was placed to a Pleur-Evac on suction.  He was placed back in supine position, was extubated in the operating room and taken to the postanesthetic care unit in good condition.  All sponge, needle and instrument counts were correct at the end  of the procedure.  Experienced assistance was necessary for this case due to surgical complexity. [name] [name], [place] served as the Environmental consultant providing assistance with port placement, robot docking and undocking, instrument exchange, suctioning and specimen retrieval.   VAI D: 07/15/2022 6:12:58 pm T: 07/15/2022 11:08:00 pm  JOB: [id number]/ [id number]

## 2022-07-15 NOTE — Discharge Summary (Addendum)
Physician Discharge Summary  Patient ID: [name] MRN: [id number] DOB/AGE: 04-08-1976 46 y.o.  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  He has been followed since then with no evidence of recurrent disease.  An annual CT for follow-up was done a few weeks ago and showed 2 pleural-based masses in the right chest posteriorly.  These actually were present a year prior but were not mentioned in the report.  They were not present in 2021.  There was a slight increase in size over the 10 months between the scans.  Therefore I recommended a PET/CT.   PET/CT images were reviewed with [name] and his wife.  There is mild uptake.  The findings are definitive one way or the other.  While I still think this is possible to be an alternative etiology, I cannot rule out the possibility of recurrence.  By the same token this would be a pretty unusual pattern of recurrence.   We discussed 3 potential options.  One would be continued radiographic follow-up.  Since there was interval growth and there is activity on PET I would not be in favor that  option.  The other 2 options would be CT-guided needle biopsy versus surgical resection.  There is not much downside to a CT-guided needle biopsy, except that it can be very difficult to diagnose a thymoma based on that.  Therefore a negative biopsy would not really give me any sense of relief.  On the other hand surgical resection would provide a definitive diagnosis and at least initial treatment depending on what the pathology shows.   I described the proposed operation to him.  We will plan to do a robotic right VATS for resection of the pleural-based masses.  He understands that 1 or both of these may involve the nerve root.  He understands the degree of pain is unpredictable.  I informed him of the indications, risk, benefits, and alternatives.  He is a low risk patient for any major complications such as MI, DVT, PE, bleeding, or death.  Hospital course: The patient was admitted electively on 07/15/2022 taken the operating room at which time he underwent a right robotic assisted thoracoscopic surgery for resection of 2 pleural-based masses.  He tolerated the procedure well was taken the PACU in stable condition.  The patient did well overnight.  His JP drain output remained low and was removed on POD #1.  He is hemodynamically stable.  He is ambulating independently.  Discharge instructions were reviewed with the patient.  He is medically stable for discharge today.     Consults: None  Significant Diagnostic Studies: radiology:   CT scan:  IMPRESSION: 1. Status post median sternotomy. No evidence of recurrent mass or suspicious soft tissue in the anterior mediastinum. 2. However, there has been slight interval enlargement of oval pleural-based masses of the posterior right lower lobe between the ninth and tenth ribs as well as between the tenth and eleventh ribs. These have slowly enlarged over multiple prior examinations and are suspicious for pleural metastases in the setting of  thymoma. Consider PET-CT metabolic characterization and tissue sampling.   Aortic Atherosclerosis (ICD10-I70.0).     Electronically Signed   By: Delanna Ahmadi M.D.   On: 06/08/2022 14:43  Treatments: surgery:    NAME: [place] RECORD NO: [id number] ACCOUNT NO: [id number] DATE OF BIRTH: 1976/08/30 FACILITY: [facility] LOCATION: [facility] PHYSICIAN: Revonda Standard. Roxan Hockey, MD   Operative Report    DATE OF PROCEDURE: 07/15/2022   PREOPERATIVE DIAGNOSIS:  Right pleural masses.   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   PROCEDURE:  Xi robotic-assisted right thoracoscopy and resection of right pleural masses.  Intercostal nerve blocks levels 3 through 10.   SURGEON:  Revonda Standard. Roxan Hockey, MD   ASSISTANT:  Jadene Pierini.      PATHOLOGY: Pending  Discharge Exam: Blood pressure 104/67, pulse 62, temperature 97.8 F (36.6 C), temperature source Oral, resp. rate 12, height '5\' 6"'$  (1.676 m), weight 65.8 kg, SpO2 100 %.  General appearance: alert, cooperative, and no distress Heart: regular rate and rhythm Lungs: clear to auscultation bilaterally Abdomen: soft, non-tender; bowel sounds normal; no masses,  no organomegaly Extremities: extremities normal, atraumatic, no cyanosis or edema Wound: clean and dry  Discharge disposition: 01-Home or Self Care   Allergies as of 07/16/2022   No Known Allergies      Medication List     TAKE these medications    acetaminophen 500 MG tablet Commonly known as: TYLENOL Take 2 tablets (1,000 mg total) by mouth every 6 (six) hours as needed.   D3-50 1.25 MG (50000 UT) capsule Generic drug: Cholecalciferol Take 1 capsule by mouth once a week.   gabapentin 100 MG capsule Commonly known as: NEURONTIN Take 1 capsule (100 mg total) by mouth at bedtime.   ondansetron 8 MG tablet Commonly known as: Zofran Take 1 tablet (8 mg total) by mouth every 8 (eight) hours as needed for nausea or  vomiting.   traMADol 50 MG tablet Commonly known as: ULTRAM Take 1 tablet (50 mg total) by mouth every 6 (six) hours as needed (mild pain).        Follow-up Information     [name], MD Follow up on 07/27/2022.   Specialty: Cardiothoracic Surgery Why: Appointment is at 11:15, please get CXR at 10:45 at [facility] located on the first floor of our office building Contact information: [address] [place] [address] [phone]                 Signed: Ellwood Handler, PA-C  07/16/2022, 9:23 AM

## 2022-07-15 NOTE — Brief Op Note (Addendum)
07/15/2022  4:55 PM  PATIENT:  [name]  46 y.o. male  PRE-OPERATIVE DIAGNOSIS:  RIGHT PLEURAL MASSES  POST-OPERATIVE DIAGNOSIS:  RIGHT PLEURAL  MASSES_ PROBABLE RECURRENT THYMOMA  PROCEDURE:   Xi- ROBOTIC ASSISTED RIGHT THORACOSCOPY  RESECTION OF RIGHT PLEURAL MASSES INTERCOSTAL NERVE BLOCKS_ LEVELS 3-10  SURGEON:  Surgeon(s) and Role:    * [name], MD - Primary  PHYSICIAN ASSISTANT: Jadene Pierini, PA-C  ASSISTANTS: none   ANESTHESIA:   general  EBL:  30 mL   BLOOD ADMINISTERED:none  DRAINS: (19 [place]) Blake drain(s) in the right hemithorax    LOCAL MEDICATIONS USED:  BUPIVICAINE  and OTHER Exparel  SPECIMEN:  Source of Specimen:  2 paravertebral masses  DISPOSITION OF SPECIMEN:  PATHOLOGY  COUNTS:  YES  TOURNIQUET:  * No tourniquets in log *  DICTATION: .Other Dictation: Dictation Number pending  PLAN OF CARE: Admit to inpatient   PATIENT DISPOSITION:  PACU - hemodynamically stable.   Delay start of Pharmacological VTE agent (>24hrs) due to surgical blood loss or risk of bleeding: yes  Complications: No known

## 2022-07-16 ENCOUNTER — Other Ambulatory Visit (HOSPITAL_COMMUNITY): Payer: Self-pay

## 2022-07-16 ENCOUNTER — Inpatient Hospital Stay (HOSPITAL_COMMUNITY): Payer: 59

## 2022-07-16 ENCOUNTER — Encounter (HOSPITAL_COMMUNITY): Payer: Self-pay | Admitting: Thoracic Surgery (Cardiothoracic Vascular Surgery)

## 2022-07-16 DIAGNOSIS — Z9889 Other specified postprocedural states: Secondary | ICD-10-CM

## 2022-07-16 DIAGNOSIS — J90 Pleural effusion, not elsewhere classified: Secondary | ICD-10-CM | POA: Diagnosis not present

## 2022-07-16 DIAGNOSIS — R222 Localized swelling, mass and lump, trunk: Secondary | ICD-10-CM | POA: Diagnosis not present

## 2022-07-16 DIAGNOSIS — J948 Other specified pleural conditions: Secondary | ICD-10-CM | POA: Diagnosis not present

## 2022-07-16 DIAGNOSIS — J439 Emphysema, unspecified: Secondary | ICD-10-CM | POA: Diagnosis not present

## 2022-07-16 LAB — BASIC METABOLIC PANEL
Anion gap: 9 (ref 5–15)
BUN: 14 mg/dL (ref 6–20)
CO2: 24 mmol/L (ref 22–32)
Calcium: 9.2 mg/dL (ref 8.9–10.3)
Chloride: 104 mmol/L (ref 98–111)
Creatinine, Ser: 0.98 mg/dL (ref 0.61–1.24)
GFR, Estimated: >60 mL/min (ref >60)
Glucose, Bld: 175 mg/dL — ABNORMAL HIGH (ref 70–99)
Potassium: 4.1 mmol/L (ref 3.5–5.1)
Sodium: 137 mmol/L (ref 135–145)

## 2022-07-16 LAB — CBC
HCT: 37.8 % — ABNORMAL LOW (ref 39.0–52.0)
Hemoglobin: 12.7 g/dL — ABNORMAL LOW (ref 13.0–17.0)
MCH: 27 pg (ref 26.0–34.0)
MCHC: 33.6 g/dL (ref 30.0–36.0)
MCV: 80.4 fL (ref 80.0–100.0)
Platelets: 209 10*3/uL (ref 150–400)
RBC: 4.7 MIL/uL (ref 4.22–5.81)
RDW: 13.1 % (ref 11.5–15.5)
WBC: 8.8 10*3/uL (ref 4.0–10.5)
nRBC: 0 % (ref 0.0–0.2)

## 2022-07-16 MED ORDER — ACETAMINOPHEN 500 MG PO TABS: 1000.0000 mg | ORAL_TABLET | Freq: Four times a day (QID) | ORAL | 0 refills | Status: AC | PRN

## 2022-07-16 MED ORDER — ONDANSETRON HCL 8 MG PO TABS
8.0000 mg | ORAL_TABLET | Freq: Three times a day (TID) | ORAL | 0 refills | Status: AC | PRN
  Filled 2022-07-16: qty 30, 10d supply, fill #0

## 2022-07-16 MED ORDER — TRAMADOL HCL 50 MG PO TABS
50.0000 mg | ORAL_TABLET | Freq: Four times a day (QID) | ORAL | 0 refills | Status: AC | PRN
  Filled 2022-07-16: qty 30, 8d supply, fill #0

## 2022-07-16 MED ORDER — GABAPENTIN 100 MG PO CAPS: 100.0000 mg | ORAL_CAPSULE | Freq: Every day | ORAL | Status: DC

## 2022-07-16 MED ORDER — GABAPENTIN 100 MG PO CAPS
100.0000 mg | ORAL_CAPSULE | Freq: Every day | ORAL | 1 refills | Status: DC
  Filled 2022-07-16: qty 30, 30d supply, fill #0

## 2022-07-16 NOTE — Progress Notes (Incomplete)
Patient given discharge instructions by Kathleene Hazel nurse. Patient discharged by wheelchair to front by Kathleene Hazel nurse.

## 2022-07-16 NOTE — Progress Notes (Signed)
      [address]       [place] [address]             [phone]      1 Day Post-Op Procedure(s) (LRB): XI ROBOTIC ASSISTED RESECTION OF PARAVERTEBRAL MASSES (Right) INTERCOSTAL NERVE BLOCK  Subjective:  Overall doing well.  He had some mild post operative nausea, which has resolved.    Objective: Vital signs in last 24 hours: Temp:  [97.5 F (36.4 C)-98.1 F (36.7 C)] 98 F (36.7 C) (11/17 0331) Pulse Rate:  [44-75] 62 (11/17 0331) Cardiac Rhythm: Normal sinus rhythm (11/17 0700) Resp:  [14-20] 15 (11/17 0331) BP: (104-117)/(55-71) 104/60 (11/17 0331) SpO2:  [96 %-100 %] 99 % (11/17 0331) Weight:  [65.8 kg] 65.8 kg (11/16 1145)  Intake/Output from previous day: 11/16 0701 - 11/17 0700 In: 1800 [I.V.:1600; IV Piggyback:200] Out: 90 [Blood:30; Chest Tube:60]  General appearance: alert, cooperative, and no distress Heart: regular rate and rhythm Lungs: clear to auscultation bilaterally Abdomen: soft, non-tender; bowel sounds normal; no masses,  no organomegaly Extremities: extremities normal, atraumatic, no cyanosis or edema Wound: clean and dry  Lab Results: Recent Labs    07/13/22 1505 07/16/22 0023  WBC 4.5 8.8  HGB 13.0 12.7*  HCT 39.9 37.8*  PLT 261 209   BMET:  Recent Labs    07/13/22 1505 07/16/22 0023  NA 141 137  K 3.6 4.1  CL 108 104  CO2 28 24  GLUCOSE 76 175*  BUN 8 14  CREATININE 0.87 0.98  CALCIUM 9.2 9.2    PT/INR:  Recent Labs    07/13/22 1505  LABPROT 13.6  INR 1.1   ABG    Component Value Date/Time   PHART 7.410 07/07/2013 0442   HCO3 24.2 (H) 07/07/2013 0442   TCO2 25 07/07/2013 0442   O2SAT 97.0 07/07/2013 0442   CBG (last 3)  No results for input(s): "GLUCAP" in the last 72 hours.  Assessment/Plan: S/P Procedure(s) (LRB): XI ROBOTIC ASSISTED RESECTION OF PARAVERTEBRAL MASSES (Right) INTERCOSTAL NERVE BLOCK  CV- hemodynamically stable Pulm- JP drain with 30 cc output, no pneumothorax.. will  d/c drain today Renal- creatinine, lytes are normal Dispo- patient doing well, will d/c drain and d/c home today   LOS: 1 day    Ellwood Handler, PA-C 07/16/2022

## 2022-07-16 NOTE — Progress Notes (Signed)
Patient provided with verbal discharge instructions. Paper copy of discharge provided to patient. SWOT RN answered all questions. VSS at discharge. IV removed. Patient belongings sent with patient. Patient dc'd via wheelchair to private vehicle at [facility]	[place].

## 2022-07-16 NOTE — Anesthesia Postprocedure Evaluation (Signed)
Anesthesia Post Note  Patient: [name] [name]  Procedure(s) Performed: XI ROBOTIC ASSISTED RESECTION OF PARAVERTEBRAL MASSES (Right) INTERCOSTAL NERVE BLOCK     Patient location during evaluation: PACU Anesthesia Type: General Level of consciousness: awake Pain management: pain level controlled Vital Signs Assessment: post-procedure vital signs reviewed and stable Respiratory status: spontaneous breathing, nonlabored ventilation and respiratory function stable Cardiovascular status: blood pressure returned to baseline and stable Postop Assessment: no apparent nausea or vomiting Anesthetic complications: no  No notable events documented.  Last Vitals:  Vitals:   07/15/22 2327 07/16/22 0331  BP: 105/67 104/60  Pulse: 60 62  Resp: 20 15  Temp: 36.7 C 36.7 C  SpO2: 99% 99%    Last Pain:  Vitals:   07/16/22 0331  TempSrc: Oral  PainSc:                  Karyl Kinnier Jasmine Mcbeth

## 2022-07-16 NOTE — Anesthesia Postprocedure Evaluation (Signed)
Anesthesia Post Note  Patient: [name] [facility]  Procedure(s) Performed: XI ROBOTIC ASSISTED RESECTION OF PARAVERTEBRAL MASSES (Right) INTERCOSTAL NERVE BLOCK     Patient location during evaluation: PACU Anesthesia Type: General Level of consciousness: awake Pain management: pain level controlled Vital Signs Assessment: post-procedure vital signs reviewed and stable Respiratory status: spontaneous breathing, nonlabored ventilation, respiratory function stable and patient connected to nasal cannula oxygen Cardiovascular status: blood pressure returned to baseline and stable Postop Assessment: no apparent nausea or vomiting Anesthetic complications: no  No notable events documented.  Last Vitals:  Vitals:   07/15/22 2327 07/16/22 0331  BP: 105/67 104/60  Pulse: 60 62  Resp: 20 15  Temp: 36.7 C 36.7 C  SpO2: 99% 99%    Last Pain:  Vitals:   07/16/22 0331  TempSrc: Oral  PainSc:                  Karyl Kinnier Shakeria Robinette

## 2022-07-26 ENCOUNTER — Other Ambulatory Visit: Payer: Self-pay | Admitting: Thoracic Surgery (Cardiothoracic Vascular Surgery)

## 2022-07-26 DIAGNOSIS — J9859 Other diseases of mediastinum, not elsewhere classified: Secondary | ICD-10-CM

## 2022-07-26 NOTE — Progress Notes (Signed)
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   Presentation:  History of present illness: At time of Dr. 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  He has been followed since then with no evidence of recurrent disease.  An annual CT for follow-up was done a few weeks ago and showed 2 pleural-based masses in the right chest posteriorly.  These actually were present a year prior but were not mentioned in the report.  They were not present in 2021.  There was a slight increase in size over the 10 months between the scans.  Therefore I recommended a PET/CT.     Biopsies of (if applicable) revealed:  69-48-54 FINAL MICROSCOPIC DIAGNOSIS:  A. PLEURAL MASS, RIGHT #1, RESECTION: -Consistent with involvement by the patient's known thymoma (see comment)  B. PLEURAL MASS, RIGHT #2, RESECTION: - Consistent with involvement by the patient's known thymoma  COMMENT:  Morphologic evaluation reveals lobules of irregular size and shape delineated by fibrous septae.  Polyclonal/oval epithelial cells are present among the lymphocytes as single cells and clusters. Immunohistochemical stains reveal pancytokeratin and p40 positive epithelial cells intermixed with T lymphocytes positive for CD3, CD99 and CD5.  The findings are consistent with involvement by the patient's known thymoma.    INTRAOPERATIVE DIAGNOSIS:  A1. PLEURAL MASS, RIGHT #1, FROZEN SECTION:        Consistent with thymoma.       Rapid intraoperative consult diagnosis rendered by Dr. Alric Seton @ [phone] 07/15/2022.  GROSS DESCRIPTION:  Specimen A: Received fresh for rapid intraoperative consult evaluation by frozen section is a 2.1 x 1.8 x 0.4 cm aggregate of tan-pink to dark red soft tissue, and yellow-pink soft fibromembranous tissue. Representative sections are submitted in block 1  for frozen section, with remaining specimen sectioned and entirely submitted in blocks 2, 3 for routine histology.  Specimen B: Received fresh is a 2.4 x 1.7 x 1.1 cm soft to rubbery mass which has a partially disrupted surface, and opposing pink-red smooth glistening surface.  Cut surfaces are pink-red, solid and otherwise unremarkable. Section and entirely submitted in 3 blocks.  SW 07/16/2022   Tobacco/Marijuana/Snuff/ETOH use:   Past/Anticipated interventions by cardiothoracic surgery, if any:  07-15-22 PROCEDURE:   Xi robotic-assisted right thoracoscopy  Resection of right pleural/ paravertebral masses.   Intercostal nerve blocks levels 3 through 10.   SURGEON:  Revonda Standard. Roxan Hockey, MD   Past/Anticipated interventions by medical oncology, if any: none at this time in [facility]   PET scan: 06-22-22 IMPRESSION: 1. No signs of tracer avid tumor recurrence within the anterior mediastinum. 2. There are 2 pleural-based soft tissue nodules overlying the posteromedial right lower lobe which slightly increased when compared with 07/30/2021. There is mild tracer uptake associated with these lesions with SUV max of 1.63 and 1.37. Etiology indeterminate. However, given the progression compared with the original PET-CT from 08/13/2019 continued interval surveillance versus biopsy is advised. 3. Aortic Atherosclerosis (ICD10-I70.0).    Signs/Symptoms Weight changes, if any: no weight changes Respiratory complaints, if any: none,  Hemoptysis, if any: none Pain issues, if any:  yes, 6-7/10 at surgical site  SAFETY ISSUES: Prior radiation? no Pacemaker/ICD? no  Possible current pregnancy?no Is the patient on methotrexate? no  Current Complaints / other details:  none at this time,   Vitals:   07/28/22 1349  BP: 103/63  Pulse: (!) 51  Resp: 18  Temp: (!) 97.5 F (36.4 C)  SpO2: 100%

## 2022-07-27 ENCOUNTER — Other Ambulatory Visit (HOSPITAL_COMMUNITY): Payer: Self-pay

## 2022-07-27 ENCOUNTER — Ambulatory Visit (INDEPENDENT_AMBULATORY_CARE_PROVIDER_SITE_OTHER): Payer: Self-pay | Admitting: Thoracic Surgery (Cardiothoracic Vascular Surgery)

## 2022-07-27 ENCOUNTER — Ambulatory Visit
Admission: RE | Admit: 2022-07-27 | Discharge: 2022-07-27 | Disposition: A | Discharge status: Home / Self Care | Payer: 59 | Source: Ambulatory Visit | Attending: Thoracic Surgery (Cardiothoracic Vascular Surgery) | Admitting: Thoracic Surgery (Cardiothoracic Vascular Surgery)

## 2022-07-27 VITALS — BP 109/72 | HR 64 | Resp 20 | Ht 66.0 in | Wt 145.0 lb

## 2022-07-27 DIAGNOSIS — D4989 Neoplasm of unspecified behavior of other specified sites: Secondary | ICD-10-CM

## 2022-07-27 DIAGNOSIS — R079 Chest pain, unspecified: Secondary | ICD-10-CM | POA: Diagnosis not present

## 2022-07-27 DIAGNOSIS — Z09 Encounter for follow-up examination after completed treatment for conditions other than malignant neoplasm: Secondary | ICD-10-CM

## 2022-07-27 DIAGNOSIS — J9859 Other diseases of mediastinum, not elsewhere classified: Secondary | ICD-10-CM

## 2022-07-27 LAB — SURGICAL PATHOLOGY

## 2022-07-27 MED ORDER — GABAPENTIN 100 MG PO CAPS
100.0000 mg | ORAL_CAPSULE | Freq: Two times a day (BID) | ORAL | 2 refills | Status: DC
  Filled 2022-07-27: qty 60, 30d supply, fill #0

## 2022-07-27 NOTE — Progress Notes (Signed)
[address]       [name],[name] [id number]             [id number]     HPI: [name] returns for a scheduled follow-up visit after recent robotic VATS to resect pleural-based tumors.  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  He underwent a thymoma via a partial sternotomy by Dr. Servando Snare back in 2014.  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  He was then followed by Dr. Jana Hakim.  He presented back a couple of weeks ago with an annual CT.  There were 2 pleural-based masses in the right lower chest.  Those have been present a year prior in retrospect.  There have been some interval growth.  On PET/CT there was some mild activity.  I did a robotic right VATS to resect those masses on 07/15/2022.  They turned out to be thymoma.  Grossly we had a complete resection.  He went home on postoperative day #1.  He really did not have much pain at all for about a week.  Since then he has been having some pain and paresthesias along the right costal margin.  He is not taking any tramadol.  He took gabapentin for the first time last night.  That was 100 mg tablet.  He was able to sleep better after that.  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 30 tablet 0   Cholecalciferol (VITAMIN D3) 1.25 MG (50000 UT) CAPS Take 1 capsule by mouth once a week. 12 capsule 5   ondansetron (ZOFRAN) 8 MG tablet Take 1 tablet (8 mg total) by mouth every 8 (eight) hours as needed for nausea or vomiting. 30 tablet 0   traMADol (ULTRAM) 50 MG tablet Take 1 tablet (50 mg total) by  mouth every 6 (six) hours as needed (mild pain). 30 tablet 0   gabapentin (NEURONTIN) 100 MG capsule Take 1 capsule (100 mg total) by mouth 2 (two) times daily. 60 capsule 2   No current facility-administered medications for this visit.    Physical Exam BP 109/72 (BP Location: Left Arm, Patient Position: Sitting, Cuff Size: Normal)   Pulse 64   Resp 20   Ht '5\' 6"'$  (1.676 m)   Wt 145 lb (65.8 kg)   SpO2 100% Comment: RA  BMI 23.40 kg/m  Well-appearing 46 year old male in no acute distress Alert and oriented x3 with no focal deficits Lungs clear with equal breath sounds bilaterally Incisions healing well  Diagnostic Tests: CHEST - 2 VIEW   COMPARISON:  Chest radiograph 07/16/2022.   FINDINGS: Median sternotomy wires are stable. The right-sided chest tube has been removed.   There is no focal consolidation or pulmonary edema. There is no pleural effusion or pneumothorax   There is no acute osseous abnormality.   IMPRESSION: Interval removal of the right-sided chest tube with no appreciable residual pneumothorax.     Electronically Signed   By: Court Joy.D.  On: 07/27/2022 11:48 I personally reviewed the chest x-ray images.  Normal postoperative appearance.  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  He had a thymectomy for a 6.3 cm thymoma by Dr. Servando Snare in 2014.  He had been followed with no evidence of recurrent disease.  On CT this year he was found to have 2 pleural-based masses in the right chest.  On PET there was some activity there and no evidence of disease elsewhere.  I did a robotic resection in both nodules did turn out to be thymoma.  From a surgical standpoint he is doing well.  He does have some discomfort.  Is not really been taking anything for that.  I encouraged him to take the gabapentin nightly for 3 to 4 days.  He can then increase to twice daily if needed for 3 to 4 days and then if still having  discomfort can increase to 3 times daily.  Also encouraged him to use Tylenol plus ibuprofen.  He has not used any of his tramadol yet but has that as a backup if the pain becomes severe.  Advised him to avoid heavy lifting.  He can participate in other activities as tolerated.  He has an appointment to see Dr. Isidore Moos tomorrow.  He has not heard from Dr. Worthy Flank office yet.  We will contact them again today.  Plan: See Dr. Lanell Persons scheduled Referral to Dr. Julien Nordmann Medications described above Return in 1 month for follow-up.  He knows how to me in the meantime if necessary.  [name], MD [facility] [phone]

## 2022-07-27 NOTE — Progress Notes (Signed)
Radiation Oncology         [phone] ________________________________  Initial Outpatient Consultation  Name: [name] MRN: [id number]  Date: 07/28/2022  DOB: 10/23/1975  ML:[id number], Annie Main, MD  [name], *   REFERRING PHYSICIAN: [name], *  DIAGNOSIS: C37   ICD-10-CM   1. Thymoma  D49.89     2. 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 Servando Snare in 2014 via a partial sternotomy (tumor did extend to the margin). He opted against adjuvant radiation and was followed by Dr. Jana Hakim and Dr. Servando Snare until they both retired. The patient was without evidence of disease recurrence until his recent history, detailed as follows.   The patient recently presented for an annual follow-up chest CT on 06/08/22 which showed slight interval enlargement of several oval pleural-based masses in the posterior right lower lobe between the ninth and tenth ribs, as well as between the tenth and eleventh ribs suspicious for pleural metastases in the setting of thymoma (given their slow and gradual enlargement over multiple prior examinations).   Given that Dr. Servando Snare has retired, the patient established care with Dr. Roxan Hockey on 06/08/22. In the setting of his history of thymoma, Dr. Roxan Hockey discussed how the increase in size of the pleural based masses would warrant proceeding with either surgical resectioning vs further evaluation via PET scan.   The patient opted to proceed with a restaging PET scan on 06/22/22 which demonstrated mild tracer uptake associated with the the 2 pleural based soft tissue nodules  overlying the posteromedial right lower lobe  (SUV max of 1.63 and 1.37). The etiology of the lesions appeared to be indeterminate. However, given progression when compared to his original PET scan from 08/13/2019, interval surveillance vs biopsies were advised. PET otherwise showed so signs of tracer avid tumor recurrence within the anterior mediastinum.   Given that disease recurrence could not be entirely excluded, the patient opted to proceed with robotic right VATS for resection of the pleural-based masses on 07/15/22 under the care of Dr. Roxan Hockey. Pathology from the procedure revealed findings consistent with involvement by the patient's known thymoma in both of the masses.   Chest x-ray performed on 07/15/22 showed a small right apical pneumothorax.   During his most recent follow-up visit with Dr. Roxan Hockey yesterday, the patient endorsed some pain and paresthesias along the right costal margin (began the week after surgery). He was noted to have taken gabapentin the night prior with relief. Dr. Roxan Hockey has encouraged him to take the gabapentin nightly for 3 to 4 days. (CXR performed yesterday showed no appreciable residual pneumothorax).   Today he is present with his supportive wife. The initial pathology report was unclear on the margins, subtype, and the comparison to the previous sample so the patient called back again for clarification.  Margin status remains unclear but suspicious for microscopic positivity.  The patient and his wife are understandably interested in getting a second opinion from a thymoma specialist to review the pathology and discuss treatment options.   PATHOLOGY:  FINAL MICROSCOPIC DIAGNOSIS:  A. PLEURAL MASS, RIGHT #1, RESECTION: -Consistent with involvement by the patient's  known thymoma (see comment)  B. PLEURAL MASS, RIGHT #2, RESECTION: - Consistent with involvement by the patient's known thymoma  COMMENT:  Morphologic evaluation reveals  lobules of irregular size and shape delineated by fibrous septae.  Polyclonal/oval epithelial cells are present among the lymphocytes as single cells and clusters. Immunohistochemical stains reveal pancytokeratin and p40 positive epithelial cells intermixed with T lymphocytes positive for CD3, CD99 and CD5.  The findings are consistent with involvement by the patient's known thymoma.    INTRAOPERATIVE DIAGNOSIS:  A1. PLEURAL MASS, RIGHT #1, FROZEN SECTION:        Consistent with thymoma.       Rapid intraoperative consult diagnosis rendered by Dr. Alric Seton @ [phone] 07/15/2022.  GROSS DESCRIPTION:  Specimen A: Received fresh for rapid intraoperative consult evaluation by frozen section is a 2.1 x 1.8 x 0.4 cm aggregate of tan-pink to dark red soft tissue, and yellow-pink soft fibromembranous tissue. Representative sections are submitted in block 1 for frozen section, with remaining specimen sectioned and entirely submitted in blocks 2, 3 for routine histology.  Specimen B: Received fresh is a 2.4 x 1.7 x 1.1 cm soft to rubbery mass which has a partially disrupted surface, and opposing pink-red smooth glistening surface.  Cut surfaces are pink-red, solid and otherwise unremarkable. Section and entirely submitted in 3 blocks.  SW 07/16/2022   Final Diagnosis performed by Tilford Pillar, MD.   Electronically signed 07/21/2022 Technical component performed at [facility]. [facility], [place] [address] [place], [place] [address].  Professional component performed at [facility], [place] [address] [place], [place] [address].  Immunohistochemistry Technical component (if applicable) was performed at [facility]. [address], [place], [place], [place] [address].   IMMUNOHISTOCHEMISTRY DISCLAIMER (if applicable): Some of these immunohistochemical stains may have been developed and the performance characteristics determine by [facility]. Some may not have been cleared or approved by the U.S. Food and Drug Administration. The FDA has determined that such clearance or approval is not necessary. This test is used for clinical purposes. It should not be regarded as investigational or for research. This laboratory is certified under the [place] (CLIA-88) as qualified to perform high complexity clinical laboratory testing.  The controls stained appropriately.  ADDENDUM: This case was reviewed with Dr. Saralyn Pilar and Dr. Vic Ripper who agree that current morphology is concordant with the patient's previous pathology i.e. type B2 thymoma. Assessment of margin status on part A is limited by the fragmented nature of the specimen. On part B, specimen  is also received partially disrupted, with no oriented margin, however the resection edge opposite to the parietal pleura appears to show focal involvement by the tumor.    PREVIOUS RADIATION THERAPY: No  PAST MEDICAL HISTORY:  has a past medical history of Bronchitis (06/30/2013), Dermoid cyst of eyelid, Laryngomalacia, congenital, Shortness of breath, and Thymoma (07/06/2013).    PAST SURGICAL HISTORY: Past Surgical History:  Procedure Laterality Date   INTERCOSTAL NERVE BLOCK  07/15/2022   Procedure: INTERCOSTAL NERVE BLOCK;  Surgeon: [name], MD;  Location: [facility];  Service: Thoracic;;   MEDIASTERNOTOMY N/A 07/06/2013   Procedure: PARTIAL MEDIASTERNOTOMY//RESECTION OF ANTERIOR MEDIASTINAL MASS;  Surgeon: Grace Isaac, MD;  Location: [facility];  Service: Thoracic;  Laterality: N/A;  RESECTION OF ANTERIOR MEDIASTINAL MASS   MINOR HEMORRHOIDECTOMY     tracheotomy      6 months old for 3 months   VIDEO BRONCHOSCOPY  N/A 07/06/2013   Procedure: VIDEO BRONCHOSCOPY;  Surgeon: Grace Isaac, MD;  Location: [facility] OR;  Service: Thoracic;  Laterality: N/A;    FAMILY HISTORY: family history includes Cancer in his  paternal grandmother; Cancer (age of onset: 69) in his paternal grandfather.  SOCIAL HISTORY:  reports that he quit smoking about 28 years ago. His smoking use included cigarettes. He has a 0.75 pack-year smoking history. He has never used smokeless tobacco. He reports current alcohol use. He reports that he does not use drugs.  ALLERGIES: Patient has no known allergies.  MEDICATIONS:  Current Outpatient Medications  Medication Sig Dispense Refill   acetaminophen (TYLENOL) 500 MG tablet Take 2 tablets (1,000 mg total) by mouth every 6 (six) hours as needed. 30 tablet 0   Cholecalciferol (VITAMIN D3) 1.25 MG (50000 UT) CAPS Take 1 capsule by mouth once a week. 12 capsule 5   gabapentin (NEURONTIN) 100 MG capsule Take 1 capsule (100 mg total) by mouth 2 (two) times daily. 60 capsule 2   ibuprofen (ADVIL) 200 MG tablet Take 200 mg by mouth every 6 (six) hours as needed for moderate pain.     ondansetron (ZOFRAN) 8 MG tablet Take 1 tablet (8 mg total) by mouth every 8 (eight) hours as needed for nausea or vomiting. (Patient not taking: Reported on 07/28/2022) 30 tablet 0   traMADol (ULTRAM) 50 MG tablet Take 1 tablet (50 mg total) by mouth every 6 (six) hours as needed (mild pain). (Patient not taking: Reported on 07/28/2022) 30 tablet 0   No current facility-administered medications for this encounter.    REVIEW OF SYSTEMS:  Notable for that above.   PHYSICAL EXAM:  height is '5\' 6"'$  (1.676 m) and weight is 149 lb 9.6 oz (67.9 kg). His temperature is 97.5 F (36.4 C) (abnormal). His blood pressure is 103/63 and his pulse is 51 (abnormal). His respiration is 18 and oxygen saturation is 100%.   General: Alert and oriented, in no acute distress  HEENT: Head is normocephalic. Extraocular movements are intact.   Heart: Regular in rate and rhythm with no murmurs, rubs, or gallops. Chest: Clear to auscultation bilaterally, with no rhonchi, wheezes, or rales. 3 well healing surgical scars. No erythema,  drainage, or other signs of infection present.  Extremities: No cyanosis or edema. Skin: No concerning lesions. Musculoskeletal: symmetric strength and muscle tone throughout. Neurologic: Cranial nerves II through XII are grossly intact. No obvious focalities. Speech is fluent. Coordination is intact. Psychiatric: Judgment and insight are intact. Affect is appropriate.   ECOG = 0  0 - Asymptomatic (Fully active, able to carry on all predisease activities without restriction)  1 - Symptomatic but completely ambulatory (Restricted in physically strenuous activity but ambulatory and able to carry out work of a light or sedentary nature. For example, light housework, office work)  2 - Symptomatic, <50% in bed during the day (Ambulatory and capable of all self care but unable to carry out any work activities. Up and about more than 50% of waking hours)  3 - Symptomatic, >50% in bed, but not bedbound (Capable of only limited self-care, confined to bed or chair 50% or more of waking hours)  4 - Bedbound (Completely disabled. Cannot carry on any self-care. Totally confined to bed or chair)  5 - Death   Eustace Pen MM, Creech RH, Tormey DC, et al. [phone]). "Toxicity and response criteria of the [facility]". [place] Oncol. 5 (6): 649-55   LABORATORY DATA:  Lab Results  Component Value Date   WBC 8.8 07/16/2022   HGB 12.7 (L) 07/16/2022   HCT 37.8 (L) 07/16/2022   MCV 80.4 07/16/2022   PLT 209 07/16/2022   CMP     Component Value Date/Time   NA 137 07/16/2022 0023   K 4.1 07/16/2022 0023   CL 104 07/16/2022 0023   CO2 24 07/16/2022 0023   GLUCOSE 175 (H) 07/16/2022 0023   BUN 14 07/16/2022 0023   CREATININE 0.98 07/16/2022 0023   CALCIUM 9.2 07/16/2022 0023   PROT 6.7 07/13/2022 1505   ALBUMIN 4.0 07/13/2022 1505   AST 24 07/13/2022 1505   ALT 11 07/13/2022 1505   ALKPHOS 49 07/13/2022 1505   BILITOT 1.0 07/13/2022 1505   GFRNONAA >60 07/16/2022 0023    GFRAA >90 07/08/2013 0520         RADIOGRAPHY: DG Chest 2 View  Result Date: 07/27/2022 CLINICAL DATA:  Mediastinal mass, chest pain. EXAM: CHEST - 2 VIEW COMPARISON:  Chest radiograph 07/16/2022. FINDINGS: Median sternotomy wires are stable. The right-sided chest tube has been removed. There is no focal consolidation or pulmonary edema. There is no pleural effusion or pneumothorax There is no acute osseous abnormality. IMPRESSION: Interval removal of the right-sided chest tube with no appreciable residual pneumothorax. Electronically Signed   By: Valetta Mole M.D.   On: 07/27/2022 11:48   DG Chest Port 1 View  Result Date: 07/16/2022 CLINICAL DATA:  Chest tube present. EXAM: PORTABLE CHEST 1 VIEW COMPARISON:  CXR 07/15/22 FINDINGS: Status post median sternotomy. Sternotomy wires are intact and in unchanged positioning. Right-sided thoracostomy tube in place with the tip projecting over the paramediastinal right upper lobe. Pleural effusion. No pneumothorax. Focal airspace opacity. Compared to prior exam there is slight interval increase in subcutaneous emphysema along the right chest wall. IMPRESSION: Right-sided thoracostomy tube in place with the tip projecting over the paramediastinal right upper lobe. No large pneumothorax. Electronically Signed   By: Marin Roberts M.D.   On: 07/16/2022 08:18   DG Chest Port 1 View  Result Date: 07/15/2022 CLINICAL DATA:  Thymoma, paraspinal mass EXAM: PORTABLE CHEST 1 VIEW COMPARISON:  07/13/2022, 06/22/2022 FINDINGS: Single frontal view of the chest demonstrates postsurgical changes from median sternotomy. Surgical drain overlies the upper mediastinum, new since prior study. There is a small right apical pneumothorax, volume estimated less than 5%. No pleural effusion. No acute airspace disease. Cardiac silhouette is unremarkable. IMPRESSION: 1. Small right apical pneumothorax, volume estimated less than 5%. No tension effect. 2. Interval placement of a  surgical drain overlying the upper mediastinum. Critical Value/emergent results were called by telephone at the time of interpretation on 07/15/2022 at 757pm to provider DR [facility], who verbally acknowledged these results. Electronically Signed   By: Randa Ngo M.D.   On: 07/15/2022 20:01   DG Chest 2 View  Result Date: 07/15/2022 CLINICAL DATA:  Preop for paraspinal surgery. EXAM: CHEST - 2 VIEW COMPARISON:  May 09, 2014. FINDINGS: The heart size and mediastinal contours are within normal limits. Sternotomy wires are noted. Both lungs are clear. The visualized skeletal structures are unremarkable. IMPRESSION: No active cardiopulmonary disease. Electronically Signed   By: Marijo Conception M.D.   On: 07/15/2022 08:54      IMPRESSION/PLAN: History of thymoma type B2, resected without adjuvant radiation therapy in 2014.   He recently underwent surgical resection of 2 pleural-based lesions consistent with recurrent disease.  Surgical report is suspicious for microscopically positive margins  Because  of the somewhat unclear pathology report, I spoke with Dr. Roxan Hockey today on the phone and he stated the two lesions appeared to be sessile and peeled off quite easily. They did not appear to be invading past the pleura and it he feels confident that he had grossly clear margins.   Today we discussed there is no clear standard of care with this patient's case. The benefit of radiation is mainly to delay or prevent local recurrence of thymoma, although it is unlikely to affect his life expectancy.  Based on the available information, it is very reasonable to observe him closely with scans (he sees Dr. Julien Nordmann in the near future to discuss surveillance plans) and only treat if there is a sign of disease recurrence.   While I would lean towards observation given my discussion with Dr. Roxan Hockey regarding his intraoperative impressions, it is not unreasonable to seek a second opinion at a tertiary center  that sees a high-volume of his type of disease.  It is also reasonable to submit his pathology for review at another institution.  After discussing this with the patient and his family they would like to get a second opinion from a a team of thymoma specialists including re-review of his pathology. We are happy to assist in this process. The patient's wife has a first cousin who is a head and neck physician at [facility] and also a connection with [facility] in [place].  She provided some suggestions for second opinions; I will look into these connections and search for other thymoma specialists as well.  I will be in touch with the patient after I do some research.  He is scheduled to see Dr. Julien Nordmann on the 14th of December.   ADDENDUM: Following consultation I got back in touch with [name] to let him know that the physician leads he received at [facility] look excellent.  He states he we will make arrangements for second opinion at their center.  On date of service, in total, I spent 65 minutes on this encounter. Patient was seen in person.   __________________________________________   Leona Singleton, PA    Eppie Gibson, MD  This document serves as a record of services personally performed by Eppie Gibson, MD. It was created on her behalf by Roney Mans, a trained medical scribe. The creation of this record is based on the scribe's personal observations and the provider's statements to them. This document has been checked and approved by the attending provider.

## 2022-07-27 NOTE — Progress Notes (Incomplete)
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    Presentation:  History of present illness: At time of Dr. 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  He has been followed since then with no evidence of recurrent disease.  An annual CT for follow-up was done a few weeks ago and showed 2 pleural-based masses in the right chest posteriorly.  These actually were present a year prior but were not mentioned in the report.  They were not present in 2021.  There was a slight increase in size over the 10 months between the scans.  Therefore I recommended a PET/CT.    Biopsies of (if applicable) revealed:  94-17-40 FINAL MICROSCOPIC DIAGNOSIS:  A. PLEURAL MASS, RIGHT #1, RESECTION: -Consistent with involvement by the patient's known thymoma (see comment)  B. PLEURAL MASS, RIGHT #2, RESECTION: - Consistent with involvement by the patient's known thymoma  COMMENT:  Morphologic evaluation reveals lobules of irregular size and shape delineated by fibrous septae.  Polyclonal/oval epithelial cells are present among the lymphocytes as single cells and clusters. Immunohistochemical stains reveal pancytokeratin and p40 positive epithelial cells intermixed with T lymphocytes positive for CD3, CD99 and CD5.  The findings are consistent with involvement by the patient's known thymoma.    INTRAOPERATIVE DIAGNOSIS:  A1. PLEURAL MASS, RIGHT #1, FROZEN SECTION:        Consistent with thymoma.       Rapid intraoperative consult diagnosis rendered by Dr. Alric Seton @ [phone] 07/15/2022.  GROSS DESCRIPTION:  Specimen A: Received fresh for rapid intraoperative consult evaluation by frozen section is a 2.1 x 1.8 x 0.4 cm aggregate of tan-pink to dark red soft tissue, and yellow-pink soft fibromembranous tissue. Representative sections are submitted in block 1  for frozen section, with remaining specimen sectioned and entirely submitted in blocks 2, 3 for routine histology.  Specimen B: Received fresh is a 2.4 x 1.7 x 1.1 cm soft to rubbery mass which has a partially disrupted surface, and opposing pink-red smooth glistening surface.  Cut surfaces are pink-red, solid and otherwise unremarkable. Section and entirely submitted in 3 blocks.  SW 07/16/2022    Tobacco/Marijuana/Snuff/ETOH use:    Past/Anticipated interventions by cardiothoracic surgery, if any:  07-15-22 PROCEDURE:   Xi robotic-assisted right thoracoscopy  Resection of right pleural/ paravertebral masses.   Intercostal nerve blocks levels 3 through 10.   SURGEON:  Revonda Standard. Roxan Hockey, MD     Past/Anticipated interventions by medical oncology, if any: none at this time in [facility]    PET scan: 06-22-22 IMPRESSION: 1. No signs of tracer avid tumor recurrence within the anterior mediastinum. 2. There are 2 pleural-based soft tissue nodules overlying the posteromedial right lower lobe which slightly increased when compared with 07/30/2021. There is mild tracer uptake associated with these lesions with SUV max of 1.63 and 1.37. Etiology indeterminate. However, given the progression compared with the original PET-CT from 08/13/2019 continued interval surveillance versus biopsy is advised. 3. Aortic Atherosclerosis (ICD10-I70.0).     Signs/Symptoms Weight changes, if any: {:18581} Respiratory complaints, if any: {:18581} Hemoptysis, if any: {:18581} Pain issues, if any:  {:18581}   SAFETY ISSUES: Prior radiation? {:18581} Pacemaker/ICD? {:18581}  Possible current pregnancy?{:18581} Is the patient on methotrexate? {:18581}   Current Complaints / other details:  ***

## 2022-07-28 ENCOUNTER — Ambulatory Visit
Admission: RE | Admit: 2022-07-28 | Discharge: 2022-07-28 | Disposition: A | Discharge status: Home / Self Care | Payer: 59 | Source: Ambulatory Visit | Attending: Radiation Oncology | Admitting: Radiation Oncology

## 2022-07-28 ENCOUNTER — Encounter: Payer: Self-pay | Admitting: Radiation Oncology

## 2022-07-28 ENCOUNTER — Telehealth: Payer: Self-pay | Admitting: Internal Medicine

## 2022-07-28 ENCOUNTER — Other Ambulatory Visit: Payer: Self-pay

## 2022-07-28 VITALS — BP 103/63 | HR 51 | Temp 97.5°F | Resp 18 | Ht 66.0 in | Wt 149.6 lb

## 2022-07-28 DIAGNOSIS — Z87891 Personal history of nicotine dependence: Secondary | ICD-10-CM | POA: Insufficient documentation

## 2022-07-28 DIAGNOSIS — R918 Other nonspecific abnormal finding of lung field: Secondary | ICD-10-CM | POA: Insufficient documentation

## 2022-07-28 DIAGNOSIS — Z79899 Other long term (current) drug therapy: Secondary | ICD-10-CM | POA: Insufficient documentation

## 2022-07-28 DIAGNOSIS — C37 Malignant neoplasm of thymus: Secondary | ICD-10-CM | POA: Diagnosis not present

## 2022-07-28 DIAGNOSIS — Z809 Family history of malignant neoplasm, unspecified: Secondary | ICD-10-CM | POA: Diagnosis not present

## 2022-07-28 DIAGNOSIS — D4989 Neoplasm of unspecified behavior of other specified sites: Secondary | ICD-10-CM | POA: Diagnosis not present

## 2022-07-28 NOTE — Telephone Encounter (Signed)
Scheduled appointment per referral. Patient is aware of appointment date and time. Patient is aware to arrive 15 mins prior to appointment time and to bring updated insurance cards. Patient is aware of location.   

## 2022-07-30 ENCOUNTER — Encounter: Payer: Self-pay | Admitting: Radiation Oncology

## 2022-08-02 ENCOUNTER — Other Ambulatory Visit: Payer: Self-pay | Admitting: Thoracic Surgery (Cardiothoracic Vascular Surgery)

## 2022-08-02 MED ORDER — GABAPENTIN 600 MG PO TABS
600.0000 mg | ORAL_TABLET | Freq: Three times a day (TID) | ORAL | 3 refills | Status: AC
  Filled 2022-08-02: qty 90, 30d supply, fill #0
  Filled 2022-08-26: qty 90, 30d supply, fill #1
  Filled 2022-10-20: qty 90, 30d supply, fill #2

## 2022-08-02 NOTE — Progress Notes (Signed)
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 Roxan Hockey, MD [facility] [phone]

## 2022-08-03 ENCOUNTER — Ambulatory Visit: Payer: 59 | Admitting: Thoracic Surgery (Cardiothoracic Vascular Surgery)

## 2022-08-03 ENCOUNTER — Other Ambulatory Visit (HOSPITAL_COMMUNITY): Payer: Self-pay

## 2022-08-10 DIAGNOSIS — C37 Malignant neoplasm of thymus: Secondary | ICD-10-CM | POA: Diagnosis not present

## 2022-08-11 ENCOUNTER — Encounter: Payer: Self-pay | Admitting: *Deleted

## 2022-08-11 ENCOUNTER — Other Ambulatory Visit: Payer: Self-pay | Admitting: Medical Oncology

## 2022-08-11 DIAGNOSIS — D4989 Neoplasm of unspecified behavior of other specified sites: Secondary | ICD-10-CM

## 2022-08-12 ENCOUNTER — Inpatient Hospital Stay: Payer: 59 | Attending: Internal Medicine | Admitting: Internal Medicine

## 2022-08-12 ENCOUNTER — Other Ambulatory Visit: Payer: Self-pay

## 2022-08-12 ENCOUNTER — Inpatient Hospital Stay: Payer: 59

## 2022-08-12 ENCOUNTER — Other Ambulatory Visit (HOSPITAL_COMMUNITY): Payer: Self-pay

## 2022-08-12 VITALS — BP 107/71 | HR 70 | Temp 97.9°F | Resp 16 | Ht 66.0 in | Wt 157.0 lb

## 2022-08-12 DIAGNOSIS — R52 Pain, unspecified: Secondary | ICD-10-CM | POA: Insufficient documentation

## 2022-08-12 DIAGNOSIS — Z8042 Family history of malignant neoplasm of prostate: Secondary | ICD-10-CM | POA: Diagnosis not present

## 2022-08-12 DIAGNOSIS — M5414 Radiculopathy, thoracic region: Secondary | ICD-10-CM | POA: Diagnosis not present

## 2022-08-12 DIAGNOSIS — Z79899 Other long term (current) drug therapy: Secondary | ICD-10-CM | POA: Insufficient documentation

## 2022-08-12 DIAGNOSIS — D4989 Neoplasm of unspecified behavior of other specified sites: Secondary | ICD-10-CM

## 2022-08-12 DIAGNOSIS — Z85238 Personal history of other malignant neoplasm of thymus: Secondary | ICD-10-CM | POA: Diagnosis not present

## 2022-08-12 DIAGNOSIS — G629 Polyneuropathy, unspecified: Secondary | ICD-10-CM | POA: Insufficient documentation

## 2022-08-12 DIAGNOSIS — R079 Chest pain, unspecified: Secondary | ICD-10-CM | POA: Diagnosis not present

## 2022-08-12 DIAGNOSIS — C37 Malignant neoplasm of thymus: Secondary | ICD-10-CM | POA: Diagnosis not present

## 2022-08-12 DIAGNOSIS — E039 Hypothyroidism, unspecified: Secondary | ICD-10-CM | POA: Diagnosis not present

## 2022-08-12 DIAGNOSIS — Z87891 Personal history of nicotine dependence: Secondary | ICD-10-CM | POA: Diagnosis not present

## 2022-08-12 LAB — CMP (CANCER CENTER ONLY)
ALT: 19 U/L (ref 0–44)
AST: 36 U/L (ref 15–41)
Albumin: 4.4 g/dL (ref 3.5–5.0)
Alkaline Phosphatase: 62 U/L (ref 38–126)
Anion gap: 4 — ABNORMAL LOW (ref 5–15)
BUN: 13 mg/dL (ref 6–20)
CO2: 32 mmol/L (ref 22–32)
Calcium: 10.1 mg/dL (ref 8.9–10.3)
Chloride: 105 mmol/L (ref 98–111)
Creatinine: 0.84 mg/dL (ref 0.61–1.24)
GFR, Estimated: >60 mL/min (ref >60)
Glucose, Bld: 94 mg/dL (ref 70–99)
Potassium: 4.6 mmol/L (ref 3.5–5.1)
Sodium: 141 mmol/L (ref 135–145)
Total Bilirubin: 0.7 mg/dL (ref 0.3–1.2)
Total Protein: 7 g/dL (ref 6.5–8.1)

## 2022-08-12 LAB — CBC WITH DIFFERENTIAL (CANCER CENTER ONLY)
Abs Immature Granulocytes: 0.01 10*3/uL (ref 0.00–0.07)
Basophils Absolute: 0 10*3/uL (ref 0.0–0.1)
Basophils Relative: 1 %
Eosinophils Absolute: 0.1 10*3/uL (ref 0.0–0.5)
Eosinophils Relative: 1 %
HCT: 39.8 % (ref 39.0–52.0)
Hemoglobin: 13.2 g/dL (ref 13.0–17.0)
Immature Granulocytes: 0 %
Lymphocytes Relative: 30 %
Lymphs Abs: 1.5 10*3/uL (ref 0.7–4.0)
MCH: 26.7 pg (ref 26.0–34.0)
MCHC: 33.2 g/dL (ref 30.0–36.0)
MCV: 80.4 fL (ref 80.0–100.0)
Monocytes Absolute: 0.3 10*3/uL (ref 0.1–1.0)
Monocytes Relative: 7 %
Neutro Abs: 2.9 10*3/uL (ref 1.7–7.7)
Neutrophils Relative %: 61 %
Platelet Count: 292 10*3/uL (ref 150–400)
RBC: 4.95 MIL/uL (ref 4.22–5.81)
RDW: 13 % (ref 11.5–15.5)
WBC Count: 4.8 10*3/uL (ref 4.0–10.5)
nRBC: 0 % (ref 0.0–0.2)

## 2022-08-12 MED ORDER — METHOCARBAMOL 500 MG PO TABS
500.0000 mg | ORAL_TABLET | Freq: Two times a day (BID) | ORAL | 3 refills | Status: AC
  Filled 2022-08-12: qty 60, 30d supply, fill #0
  Filled 2022-10-19: qty 60, 30d supply, fill #1

## 2022-08-12 NOTE — Progress Notes (Signed)
[place] Telephone:(336) [phone]   Fax:(336) [phone]  CONSULT NOTE  REFERRING PHYSICIAN: Dr. Modesto Charon  REASON FOR CONSULTATION:  46 years old male with recurrent thymoma.  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  The patient mentioned that in early November 2014 he presented to the emergency department complaining of pleuritic chest pain, myalgia and low-grade fever for 2 days.  During his evaluation he had imaging studies including CT angiogram of the chest that showed anterior superior mediastinal mass measuring 3.7 x 5.9 x 4.8 cm with no mass or lymphadenopathy elsewhere in the mediastinum, hilum or axilla.  He was seen by Dr. Jana Hakim and Dr. Servando Snare at that time and on July 06, 2013 he underwent video bronchoscopy with partial sternotomy and resection of the anterior mediastinal mass.  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  The patient was seen by Dr. Valere Dross radiation oncologist at that time and he did not recommend any adjuvant radiotherapy to the suspicious positive margin. The patient was followed by observation with repeat imaging studies including MRI of the chest and CT scan at regular basis.  After the retirement of Dr. Jana Hakim and Dr. Servando Snare, the patient was seen by Dr. Roxan Hockey and repeat CT scan of the chest on 06/08/2022 showed slight interval enlargement of an oval pleural-based mass of the posterior right lower lobe between the ninth and 10th ribs as well as between the 10th and 11th ribs.  The larger of these 2 lesions  superiorly measured 2.1 x 1.0 cm compared to 1.8 x 0.8 cm and the further of these very slowly enlarged over multiple prior examination. On July 15, 2022 the patient underwent exercise robotic assisted right thoracoscopy with resection of the right pleura/paravertebral masses with intercostal nerve blocks levels 3 through 10 under the care of Dr. Roxan Hockey. The final pathology [id number]) showed the pleural masses were consistent with involvement of the patient is known thymoma. Immunohistochemical stains reveal pancytokeratin and p40 positive epithelial cells intermixed with T lymphocytes positive for CD3, CD99 and CD5.  The findings are consistent with involvement by the patient's known thymoma.  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  He was seen by Dr. Isidore Moos, radiation oncologist at [facility] who recommended continuous observation.  He also had a second opinion at [facility] with radiation oncology Dr. Karlyn Agee as well as the medical oncologist who also recommended for him continuous monitoring and observation. The patient is here today for evaluation and to establish care with me for local management of his condition. When seen today he continues to have the neuropathy and pain on the right side of the chest.  He was unable to get back to work or exercise at regular basis as he did in the past.  He has no cough, shortness of breath or hemoptysis.  He has no nausea, vomiting, diarrhea or constipation.  He has no headache or visual changes.  He has no weight loss or  night sweats. Family history significant for paternal grandmother with suspicious ovarian malignancy.  Paternal grandfather had prostate cancer. The patient is married and has 1 son.  He was accompanied by his wife, [name].  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.  He smoked for few years in  his early life but quit smoking long time ago.  He has no history of alcohol or drug abuse.   HPI  Past Medical History:  Diagnosis Date   Bronchitis 06/30/2013   Dermoid cyst of eyelid    removed from left upper canthus area   Laryngomalacia, congenital    resolved   Shortness of breath    pt denies this   Thymoma 07/06/2013   WHO type B2    Past Surgical History:  Procedure Laterality Date   INTERCOSTAL NERVE BLOCK  07/15/2022   Procedure: INTERCOSTAL NERVE BLOCK;  Surgeon: [name], MD;  Location: [facility] OR;  Service: Thoracic;;   MEDIASTERNOTOMY N/A 07/06/2013   Procedure: PARTIAL MEDIASTERNOTOMY//RESECTION OF ANTERIOR MEDIASTINAL MASS;  Surgeon: Grace Isaac, MD;  Location: [place];  Service: Thoracic;  Laterality: N/A;  RESECTION OF ANTERIOR MEDIASTINAL MASS   MINOR HEMORRHOIDECTOMY     tracheotomy      6 months old for 3 months   VIDEO BRONCHOSCOPY N/A 07/06/2013   Procedure: VIDEO BRONCHOSCOPY;  Surgeon: Grace Isaac, MD;  Location: [place];  Service: Thoracic;  Laterality: N/A;    Family History  Problem Relation Age of Onset   Cancer Paternal Grandmother        intra-abdominal malignancy   Cancer Paternal Grandfather 80       prostate    Social History Social History   Tobacco Use   Smoking status: Former    Packs/day: 0.25    Years: 3.00    Total pack years: 0.75    Types: Cigarettes    Quit date: 07/03/1994    Years since quitting: 28.1   Smokeless tobacco: Never  Vaping Use   Vaping Use: Never used  Substance Use Topics   Alcohol use: Yes    Comment: once every 1-2 months   Drug use: No    No Known Allergies  Current Outpatient Medications  Medication Sig Dispense Refill   acetaminophen (TYLENOL) 500 MG tablet Take 2 tablets (1,000 mg total) by mouth every 6 (six) hours as needed. 30 tablet 0   Cholecalciferol (VITAMIN D3) 1.25 MG (50000 UT) CAPS Take 1 capsule by mouth once a week. 12 capsule 5   gabapentin (NEURONTIN) 600 MG  tablet Take 1 tablet (600 mg total) by mouth 3 (three) times daily. 90 tablet 3   ibuprofen (ADVIL) 200 MG tablet Take 200 mg by mouth every 6 (six) hours as needed for moderate pain.     ondansetron (ZOFRAN) 8 MG tablet Take 1 tablet (8 mg total) by mouth every 8 (eight) hours as needed for nausea or vomiting. (Patient not taking: Reported on 07/28/2022) 30 tablet 0   traMADol (ULTRAM) 50 MG tablet Take 1 tablet (50 mg total) by mouth every 6 (six) hours as needed (mild pain). (Patient not taking: Reported on 07/28/2022) 30 tablet 0   No current facility-administered medications for this visit.    Review of Systems  Constitutional: negative Eyes: negative Ears, nose, mouth, throat, and face: negative Respiratory: positive for pleurisy/chest pain Cardiovascular: negative Gastrointestinal: negative Genitourinary:negative Integument/breast: negative Hematologic/lymphatic: negative Musculoskeletal:negative Neurological: negative Behavioral/Psych: negative Endocrine: negative Allergic/Immunologic: negative  Physical Exam  YQM:VHQIO, healthy, no distress, well nourished, and well developed SKIN: skin color, texture, turgor are normal, no rashes or significant lesions HEAD: Normocephalic, No masses, lesions, tenderness or abnormalities EYES: normal, PERRLA, Conjunctiva are pink and non-injected EARS: External ears normal, Canals clear OROPHARYNX:no exudate, no erythema, and lips, buccal mucosa, and tongue normal  NECK: supple, no adenopathy, no JVD LYMPH:  no palpable lymphadenopathy, no hepatosplenomegaly LUNGS: clear to auscultation , and palpation HEART: regular rate & rhythm, no murmurs, and no gallops ABDOMEN:abdomen soft, non-tender, normal bowel sounds, and no masses or organomegaly BACK: Back symmetric, no curvature., No CVA tenderness EXTREMITIES:no joint deformities, effusion, or inflammation, no edema  NEURO: alert & oriented x 3 with fluent speech, no focal motor/sensory  deficits  PERFORMANCE STATUS: ECOG 0  LABORATORY DATA: Lab Results  Component Value Date   WBC 4.8 08/12/2022   HGB 13.2 08/12/2022   HCT 39.8 08/12/2022   MCV 80.4 08/12/2022   PLT 292 08/12/2022      Chemistry      Component Value Date/Time   NA 137 07/16/2022 0023   K 4.1 07/16/2022 0023   CL 104 07/16/2022 0023   CO2 24 07/16/2022 0023   BUN 14 07/16/2022 0023   CREATININE 0.98 07/16/2022 0023      Component Value Date/Time   CALCIUM 9.2 07/16/2022 0023   ALKPHOS 49 07/13/2022 1505   AST 24 07/13/2022 1505   ALT 11 07/13/2022 1505   BILITOT 1.0 07/13/2022 1505       RADIOGRAPHIC STUDIES: DG Chest 2 View  Result Date: 07/27/2022 CLINICAL DATA:  Mediastinal mass, chest pain. EXAM: CHEST - 2 VIEW COMPARISON:  Chest radiograph 07/16/2022. FINDINGS: Median sternotomy wires are stable. The right-sided chest tube has been removed. There is no focal consolidation or pulmonary edema. There is no pleural effusion or pneumothorax There is no acute osseous abnormality. IMPRESSION: Interval removal of the right-sided chest tube with no appreciable residual pneumothorax. Electronically Signed   By: Valetta Mole M.D.   On: 07/27/2022 11:48   DG Chest Port 1 View  Result Date: 07/16/2022 CLINICAL DATA:  Chest tube present. EXAM: PORTABLE CHEST 1 VIEW COMPARISON:  CXR 07/15/22 FINDINGS: Status post median sternotomy. Sternotomy wires are intact and in unchanged positioning. Right-sided thoracostomy tube in place with the tip projecting over the paramediastinal right upper lobe. Pleural effusion. No pneumothorax. Focal airspace opacity. Compared to prior exam there is slight interval increase in subcutaneous emphysema along the right chest wall. IMPRESSION: Right-sided thoracostomy tube in place with the tip projecting over the paramediastinal right upper lobe. No large pneumothorax. Electronically Signed   By: Marin Roberts M.D.   On: 07/16/2022 08:18   DG Chest Port 1 View  Result  Date: 07/15/2022 CLINICAL DATA:  Thymoma, paraspinal mass EXAM: PORTABLE CHEST 1 VIEW COMPARISON:  07/13/2022, 06/22/2022 FINDINGS: Single frontal view of the chest demonstrates postsurgical changes from median sternotomy. Surgical drain overlies the upper mediastinum, new since prior study. There is a small right apical pneumothorax, volume estimated less than 5%. No pleural effusion. No acute airspace disease. Cardiac silhouette is unremarkable. IMPRESSION: 1. Small right apical pneumothorax, volume estimated less than 5%. No tension effect. 2. Interval placement of a surgical drain overlying the upper mediastinum. Critical Value/emergent results were called by telephone at the time of interpretation on 07/15/2022 at 757pm to provider DR [facility], who verbally acknowledged these results. Electronically Signed   By: Randa Ngo M.D.   On: 07/15/2022 20:01   DG Chest  2 View  Result Date: 07/15/2022 CLINICAL DATA:  Preop for paraspinal surgery. EXAM: CHEST - 2 VIEW COMPARISON:  May 09, 2014. FINDINGS: The heart size and mediastinal contours are within normal limits. Sternotomy wires are noted. Both lungs are clear. The visualized skeletal structures are unremarkable. IMPRESSION: No active cardiopulmonary disease. Electronically Signed   By: Marijo Conception M.D.   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  He was followed by observation and recently was found to have a slowly enlarging pleural-based nodules in the right lower lobe status post resection by Dr. Roxan Hockey on July 15, 2022.  The patient currently has no evidence for any residual disease.   PLAN: I had a lengthy discussion with the patient and his wife today about his current condition and treatment  options. The patient has no residual disease after the surgical resection but he will need close monitoring and observation. I discussed with the patient his treatment options at this point and because of the indolent nature of his condition I think observation and close monitoring is very reasonable in his condition. If the patient develop any future local recurrence, he may benefit from either additional surgical resection or palliative radiation. If he developed multiple lesion and evidence of clear metastasis, he may be considered for systemic therapy with CAP. For the pain management and neuropathy, I will refer the patient to Dr. Johnny Bridge with [facility] in [place] for management of his condition and consideration of nerve block if needed. I will see the patient back for follow-up visit in 6 months for evaluation with repeat CT scan of the chest for restaging of his disease. The patient was advised to call immediately if he has any other concerning symptoms in the interval. The patient voices understanding of current disease status and treatment options and is in agreement with the current care plan.  All questions were answered. The patient knows to call the clinic with any problems, questions or concerns. We can certainly see the patient much sooner if necessary.  Thank you so much for allowing me to participate in the care of [name]. I will continue to follow up the patient with you and assist in his care.  The total time spent in the appointment was 60 minutes.  Disclaimer: This note was dictated with voice recognition software. Similar sounding words can inadvertently be transcribed and may not be corrected upon review.   Eilleen Kempf August 12, 2022, 2:29 PM

## 2022-08-13 ENCOUNTER — Other Ambulatory Visit (HOSPITAL_COMMUNITY): Payer: Self-pay

## 2022-08-13 NOTE — Progress Notes (Signed)
Referral faxed to Dr. Johnny Bridge with [facility], [place], [place].  Fax includes Referral, office notes, labs and patient demographics.  Fax #: [phone].

## 2022-08-16 DIAGNOSIS — M25652 Stiffness of left hip, not elsewhere classified: Secondary | ICD-10-CM | POA: Diagnosis not present

## 2022-08-27 ENCOUNTER — Other Ambulatory Visit: Payer: Self-pay

## 2022-08-31 ENCOUNTER — Other Ambulatory Visit: Payer: Self-pay

## 2022-09-01 ENCOUNTER — Other Ambulatory Visit (HOSPITAL_COMMUNITY): Payer: Self-pay

## 2022-09-14 ENCOUNTER — Ambulatory Visit: Payer: Self-pay | Admitting: Thoracic Surgery (Cardiothoracic Vascular Surgery)

## 2022-10-20 ENCOUNTER — Other Ambulatory Visit (HOSPITAL_COMMUNITY): Payer: Self-pay

## 2022-12-02 ENCOUNTER — Encounter: Payer: Self-pay | Admitting: Internal Medicine

## 2022-12-17 ENCOUNTER — Other Ambulatory Visit: Payer: Self-pay | Admitting: Internal Medicine

## 2022-12-28 ENCOUNTER — Other Ambulatory Visit: Payer: Self-pay | Admitting: Physician Assistant

## 2022-12-28 DIAGNOSIS — D4989 Neoplasm of unspecified behavior of other specified sites: Secondary | ICD-10-CM

## 2022-12-28 DIAGNOSIS — J9859 Other diseases of mediastinum, not elsewhere classified: Secondary | ICD-10-CM

## 2023-01-04 ENCOUNTER — Ambulatory Visit
Admission: RE | Admit: 2023-01-04 | Discharge: 2023-01-04 | Disposition: A | Discharge status: Home / Self Care | Payer: 59 | Source: Ambulatory Visit | Attending: Physician Assistant | Admitting: Physician Assistant

## 2023-01-04 DIAGNOSIS — I700 Atherosclerosis of aorta: Secondary | ICD-10-CM | POA: Diagnosis not present

## 2023-01-04 DIAGNOSIS — J9859 Other diseases of mediastinum, not elsewhere classified: Secondary | ICD-10-CM

## 2023-01-04 DIAGNOSIS — C37 Malignant neoplasm of thymus: Secondary | ICD-10-CM | POA: Diagnosis not present

## 2023-01-04 DIAGNOSIS — D4989 Neoplasm of unspecified behavior of other specified sites: Secondary | ICD-10-CM

## 2023-01-04 MED ORDER — IOPAMIDOL (ISOVUE-300) INJECTION 61%
75.0000 mL | Freq: Once | INTRAVENOUS | Status: AC | PRN
  Administered 2023-01-04: 75 mL via INTRAVENOUS

## 2023-02-07 ENCOUNTER — Encounter: Payer: Self-pay | Admitting: Medical Oncology

## 2023-02-09 ENCOUNTER — Telehealth: Payer: Self-pay | Admitting: Internal Medicine

## 2023-02-09 NOTE — Telephone Encounter (Signed)
Called patient regarding June appointment, left a voicemail.  

## 2023-02-14 ENCOUNTER — Inpatient Hospital Stay: Payer: 59 | Attending: Internal Medicine | Admitting: Internal Medicine

## 2023-03-12 ENCOUNTER — Other Ambulatory Visit: Payer: Self-pay | Admitting: Internal Medicine

## 2023-03-12 MED ORDER — ATOVAQUONE-PROGUANIL HCL 250-100 MG PO TABS: 1.0000 | ORAL_TABLET | Freq: Every day | ORAL | 0 refills | Status: AC

## 2023-03-12 MED ORDER — TYPHOID VACCINE PO CPDR: 1.0000 | DELAYED_RELEASE_CAPSULE | ORAL | 0 refills | Status: AC

## 2023-03-12 NOTE — Progress Notes (Unsigned)
Patient is going to [place] from July 28th -August 10th. Needs malaria proph and typhoid vaccine. Gave instructions to taking medication.

## 2023-05-18 DIAGNOSIS — Z1339 Encounter for screening examination for other mental health and behavioral disorders: Secondary | ICD-10-CM | POA: Diagnosis not present

## 2023-05-18 DIAGNOSIS — Z1331 Encounter for screening for depression: Secondary | ICD-10-CM | POA: Diagnosis not present

## 2023-05-18 DIAGNOSIS — C37 Malignant neoplasm of thymus: Secondary | ICD-10-CM | POA: Diagnosis not present

## 2023-05-18 DIAGNOSIS — Z0000 Encounter for general adult medical examination without abnormal findings: Secondary | ICD-10-CM | POA: Diagnosis not present

## 2023-05-18 DIAGNOSIS — M792 Neuralgia and neuritis, unspecified: Secondary | ICD-10-CM | POA: Diagnosis not present

## 2023-05-18 DIAGNOSIS — E559 Vitamin D deficiency, unspecified: Secondary | ICD-10-CM | POA: Diagnosis not present

## 2023-05-18 DIAGNOSIS — M533 Sacrococcygeal disorders, not elsewhere classified: Secondary | ICD-10-CM | POA: Diagnosis not present

## 2023-05-18 DIAGNOSIS — R82998 Other abnormal findings in urine: Secondary | ICD-10-CM | POA: Diagnosis not present

## 2023-05-18 DIAGNOSIS — I700 Atherosclerosis of aorta: Secondary | ICD-10-CM | POA: Diagnosis not present

## 2023-05-18 DIAGNOSIS — E039 Hypothyroidism, unspecified: Secondary | ICD-10-CM | POA: Diagnosis not present

## 2023-05-18 DIAGNOSIS — Z125 Encounter for screening for malignant neoplasm of prostate: Secondary | ICD-10-CM | POA: Diagnosis not present

## 2023-05-19 ENCOUNTER — Other Ambulatory Visit (HOSPITAL_COMMUNITY): Payer: Self-pay

## 2023-05-19 MED ORDER — VITAMIN D3 1.25 MG (50000 UT) PO CAPS
50000.0000 [IU] | ORAL_CAPSULE | ORAL | 5 refills | Status: AC
  Filled 2023-05-19 – 2023-06-13 (×2): qty 12, 84d supply, fill #0

## 2023-05-31 ENCOUNTER — Other Ambulatory Visit (HOSPITAL_COMMUNITY): Payer: Self-pay

## 2023-06-13 ENCOUNTER — Other Ambulatory Visit (HOSPITAL_COMMUNITY): Payer: Self-pay

## 2023-07-08 ENCOUNTER — Other Ambulatory Visit: Payer: Self-pay | Admitting: Physician Assistant

## 2023-07-08 DIAGNOSIS — D4989 Neoplasm of unspecified behavior of other specified sites: Secondary | ICD-10-CM

## 2023-07-13 ENCOUNTER — Ambulatory Visit
Admission: RE | Admit: 2023-07-13 | Discharge: 2023-07-13 | Disposition: A | Discharge status: Home / Self Care | Payer: 59 | Source: Ambulatory Visit | Attending: Physician Assistant | Admitting: Physician Assistant

## 2023-07-13 DIAGNOSIS — Z9889 Other specified postprocedural states: Secondary | ICD-10-CM | POA: Diagnosis not present

## 2023-07-13 DIAGNOSIS — D4989 Neoplasm of unspecified behavior of other specified sites: Secondary | ICD-10-CM

## 2023-07-13 DIAGNOSIS — C37 Malignant neoplasm of thymus: Secondary | ICD-10-CM | POA: Diagnosis not present

## 2023-07-13 MED ORDER — IOPAMIDOL (ISOVUE-300) INJECTION 61%
75.0000 mL | Freq: Once | INTRAVENOUS | Status: AC | PRN
  Administered 2023-07-13: 75 mL via INTRAVENOUS

## 2023-09-22 DIAGNOSIS — R29898 Other symptoms and signs involving the musculoskeletal system: Secondary | ICD-10-CM | POA: Diagnosis not present

## 2023-09-22 DIAGNOSIS — R262 Difficulty in walking, not elsewhere classified: Secondary | ICD-10-CM | POA: Diagnosis not present

## 2023-09-22 DIAGNOSIS — M25651 Stiffness of right hip, not elsewhere classified: Secondary | ICD-10-CM | POA: Diagnosis not present

## 2023-09-22 DIAGNOSIS — M25652 Stiffness of left hip, not elsewhere classified: Secondary | ICD-10-CM | POA: Diagnosis not present

## 2023-09-22 DIAGNOSIS — M532X7 Spinal instabilities, lumbosacral region: Secondary | ICD-10-CM | POA: Diagnosis not present

## 2023-12-01 ENCOUNTER — Ambulatory Visit (INDEPENDENT_AMBULATORY_CARE_PROVIDER_SITE_OTHER): Admitting: Family Medicine

## 2023-12-01 VITALS — BP 116/72 | Ht 66.0 in | Wt 144.0 lb

## 2023-12-01 DIAGNOSIS — M25511 Pain in right shoulder: Secondary | ICD-10-CM

## 2023-12-01 NOTE — Patient Instructions (Signed)
 You have rotator cuff impingement (includes subacromial bursitis, supraspinatus tendinopathy) and proximal biceps tendinitis/tenosynovitis Try to avoid painful activities (overhead activities, lifting with extended arm) as much as possible. Aleve 2 tabs twice a day with food for pain and inflammation as needed. Can take tylenol in addition to this. Subacromial injection may be beneficial to help with pain and to decrease inflammation if you're not improving. Add this on to physical therapy with transition to home exercise program. Do home exercise program with theraband and scapular stabilization exercises daily 3 sets of 10 once a day. If not improving at follow-up we will consider further injection, nitro patches. Follow up with me in 6 weeks if you're improving as expected, call me sooner if you want to go ahead with injection and you're not improving.

## 2023-12-01 NOTE — Progress Notes (Signed)
 PCP: Adrian Prince, MD  Subjective:   HPI: Patient is a 48 y.o. male here for right shoulder pain.  Chronic problem for many years.  Most recently flared a few weeks ago.  Pain worse with pull-ups and fly exercise.  Has not needed additional pain medication.  Pain has not limited his physical activity.  Past Medical History:  Diagnosis Date   Bronchitis 06/30/2013   Dermoid cyst of eyelid    removed from left upper canthus area   Laryngomalacia, congenital    resolved   Shortness of breath    pt denies this   Thymoma 07/06/2013   WHO type B2    Current Outpatient Medications on File Prior to Visit  Medication Sig Dispense Refill   acetaminophen (TYLENOL) 500 MG tablet Take 2 tablets (1,000 mg total) by mouth every 6 (six) hours as needed. 30 tablet 0   atovaquone-proguanil (MALARONE) 250-100 MG TABS tablet Take 1 tablet by mouth daily. Start taking on 7/26, take daily with food until complete 23 tablet 0   Cholecalciferol (VITAMIN D3) 1.25 MG (50000 UT) CAPS Take 1 capsule by mouth once a week. 12 capsule 5   Cholecalciferol (VITAMIN D3) 1.25 MG (50000 UT) CAPS Take 1 capsule (50,000 Units total) by mouth once a week. 12 capsule 5   gabapentin (NEURONTIN) 600 MG tablet Take 1 tablet (600 mg total) by mouth 3 (three) times daily. 90 tablet 3   ibuprofen (ADVIL) 200 MG tablet Take 200 mg by mouth every 6 (six) hours as needed for moderate pain.     methocarbamol (ROBAXIN) 500 MG tablet Take 1 tablet (500 mg total) by mouth 2 (two) times daily. 60 tablet 3   ondansetron (ZOFRAN) 8 MG tablet Take 1 tablet (8 mg total) by mouth every 8 (eight) hours as needed for nausea or vomiting. (Patient not taking: Reported on 07/28/2022) 30 tablet 0   traMADol (ULTRAM) 50 MG tablet Take 1 tablet (50 mg total) by mouth every 6 (six) hours as needed (mild pain). (Patient not taking: Reported on 07/28/2022) 30 tablet 0   typhoid (VIVOTIF) DR capsule Take 1 capsule by mouth every other day. Keep  refrigerated. Take on empty stomach with room temp water. Start now 4 capsule 0   No current facility-administered medications on file prior to visit.    Past Surgical History:  Procedure Laterality Date   INTERCOSTAL NERVE BLOCK  07/15/2022   Procedure: INTERCOSTAL NERVE BLOCK;  Surgeon: Loreli Slot, MD;  Location: [facility] OR;  Service: Thoracic;;   MEDIASTERNOTOMY N/A 07/06/2013   Procedure: PARTIAL MEDIASTERNOTOMY//RESECTION OF ANTERIOR MEDIASTINAL MASS;  Surgeon: [name], MD;  Location: [facility] OR;  Service: Thoracic;  Laterality: N/A;  RESECTION OF ANTERIOR MEDIASTINAL MASS   MINOR HEMORRHOIDECTOMY     tracheotomy      6 months old for 3 months   VIDEO BRONCHOSCOPY N/A 07/06/2013   Procedure: VIDEO BRONCHOSCOPY;  Surgeon: [name], MD;  Location: [facility] OR;  Service: Thoracic;  Laterality: N/A;    No Known Allergies  BP 116/72   Ht 5\' 6"  (1.676 m)   Wt 144 lb (65.3 kg)   BMI 23.24 kg/m       No data to display              No data to display              Objective:  Physical Exam:  Gen: NAD, comfortable in exam room  Normal-appearing shoulder without deformity,  swelling, bruising Tenderness to palpation over the right anterior shoulder, specifically in bicep tendon insertion Grip strength 5/5, internal rotation 5/5, external rotation 5/5 Normal ROM Positive Hawking's, negative empty can, negative Neer, negative Speeds   Assessment & Plan:  1.  Right shoulder pain: Most likely bicep tendinitis. POC ultrasound positive for target sign over bicep tendon and mild inflammation around the supraspinatus tendon.  Patient to complete formal physical therapy, rest, and take OTC NSAIDs for 7 days.  Return as needed.

## 2024-05-04 ENCOUNTER — Other Ambulatory Visit (HOSPITAL_BASED_OUTPATIENT_CLINIC_OR_DEPARTMENT_OTHER): Payer: Self-pay | Admitting: Endocrinology

## 2024-05-04 DIAGNOSIS — E039 Hypothyroidism, unspecified: Secondary | ICD-10-CM

## 2024-05-04 DIAGNOSIS — C37 Malignant neoplasm of thymus: Secondary | ICD-10-CM

## 2024-05-07 ENCOUNTER — Other Ambulatory Visit (HOSPITAL_BASED_OUTPATIENT_CLINIC_OR_DEPARTMENT_OTHER): Payer: Self-pay | Admitting: Endocrinology

## 2024-05-07 DIAGNOSIS — E039 Hypothyroidism, unspecified: Secondary | ICD-10-CM

## 2024-05-07 DIAGNOSIS — C37 Malignant neoplasm of thymus: Secondary | ICD-10-CM

## 2024-05-08 ENCOUNTER — Other Ambulatory Visit: Payer: Self-pay | Admitting: Endocrinology

## 2024-05-08 DIAGNOSIS — C37 Malignant neoplasm of thymus: Secondary | ICD-10-CM

## 2024-05-08 DIAGNOSIS — E039 Hypothyroidism, unspecified: Secondary | ICD-10-CM

## 2024-05-16 ENCOUNTER — Ambulatory Visit
Admission: RE | Admit: 2024-05-16 | Discharge: 2024-05-16 | Disposition: A | Discharge status: Home / Self Care | Source: Ambulatory Visit | Attending: Endocrinology | Admitting: Endocrinology

## 2024-05-16 ENCOUNTER — Encounter: Payer: Self-pay | Admitting: Radiology

## 2024-05-16 DIAGNOSIS — E039 Hypothyroidism, unspecified: Secondary | ICD-10-CM

## 2024-05-16 DIAGNOSIS — C37 Malignant neoplasm of thymus: Secondary | ICD-10-CM

## 2024-05-16 DIAGNOSIS — R918 Other nonspecific abnormal finding of lung field: Secondary | ICD-10-CM | POA: Diagnosis not present

## 2024-05-16 MED ORDER — IOPAMIDOL (ISOVUE-300) INJECTION 61%
75.0000 mL | Freq: Once | INTRAVENOUS | Status: AC | PRN
  Administered 2024-05-16: 75 mL via INTRAVENOUS

## 2024-06-04 DIAGNOSIS — Z1212 Encounter for screening for malignant neoplasm of rectum: Secondary | ICD-10-CM | POA: Diagnosis not present

## 2024-06-04 DIAGNOSIS — E559 Vitamin D deficiency, unspecified: Secondary | ICD-10-CM | POA: Diagnosis not present

## 2024-06-04 DIAGNOSIS — Z1339 Encounter for screening examination for other mental health and behavioral disorders: Secondary | ICD-10-CM | POA: Diagnosis not present

## 2024-06-04 DIAGNOSIS — Z0000 Encounter for general adult medical examination without abnormal findings: Secondary | ICD-10-CM | POA: Diagnosis not present

## 2024-06-04 DIAGNOSIS — Z1331 Encounter for screening for depression: Secondary | ICD-10-CM | POA: Diagnosis not present

## 2024-06-04 DIAGNOSIS — M533 Sacrococcygeal disorders, not elsewhere classified: Secondary | ICD-10-CM | POA: Diagnosis not present

## 2024-06-04 DIAGNOSIS — E039 Hypothyroidism, unspecified: Secondary | ICD-10-CM | POA: Diagnosis not present

## 2024-06-04 DIAGNOSIS — C37 Malignant neoplasm of thymus: Secondary | ICD-10-CM | POA: Diagnosis not present

## 2024-06-04 DIAGNOSIS — I700 Atherosclerosis of aorta: Secondary | ICD-10-CM | POA: Diagnosis not present

## 2024-06-04 DIAGNOSIS — R82998 Other abnormal findings in urine: Secondary | ICD-10-CM | POA: Diagnosis not present

## 2024-06-07 ENCOUNTER — Other Ambulatory Visit (HOSPITAL_COMMUNITY): Payer: Self-pay

## 2024-06-07 MED ORDER — VITAMIN D (ERGOCALCIFEROL) 50000 UNITS PO CAPS
50000.0000 [IU] | ORAL_CAPSULE | ORAL | 3 refills | Status: AC
  Filled 2024-06-07: qty 12, 84d supply, fill #0

## 2024-07-03 ENCOUNTER — Other Ambulatory Visit (HOSPITAL_COMMUNITY): Payer: Self-pay

## 2024-07-03 MED ORDER — ERYTHROMYCIN 5 MG/GM OP OINT
1.0000 | TOPICAL_OINTMENT | Freq: Three times a day (TID) | OPHTHALMIC | 0 refills | Status: AC
  Filled 2024-07-03: qty 3.5, 7d supply, fill #0
  Filled 2024-07-20: qty 3.5, 15d supply, fill #0

## 2024-07-13 ENCOUNTER — Other Ambulatory Visit (HOSPITAL_COMMUNITY): Payer: Self-pay

## 2024-07-20 ENCOUNTER — Other Ambulatory Visit (HOSPITAL_COMMUNITY): Payer: Self-pay
